# Patient Record
Sex: Male | Born: 1975
Health system: Southern US, Community
[De-identification: ages and names within clinical notes are randomized; demographics above are authoritative.]

## PROBLEM LIST (undated history)

## (undated) ENCOUNTER — Emergency Department (HOSPITAL_COMMUNITY): Admission: EM | Payer: Self-pay

---

## 1999-10-09 ENCOUNTER — Encounter: Payer: Self-pay | Admitting: Specialist

## 1999-10-09 ENCOUNTER — Ambulatory Visit (HOSPITAL_COMMUNITY): Admission: RE | Admit: 1999-10-09 | Discharge: 1999-10-09 | Payer: Self-pay | Admitting: Specialist

## 2009-01-07 ENCOUNTER — Emergency Department (HOSPITAL_BASED_OUTPATIENT_CLINIC_OR_DEPARTMENT_OTHER): Admission: EM | Admit: 2009-01-07 | Discharge: 2009-01-07 | Payer: Self-pay | Admitting: Emergency Medicine

## 2013-05-26 ENCOUNTER — Encounter (HOSPITAL_BASED_OUTPATIENT_CLINIC_OR_DEPARTMENT_OTHER): Payer: Self-pay | Admitting: *Deleted

## 2013-05-26 ENCOUNTER — Emergency Department (HOSPITAL_BASED_OUTPATIENT_CLINIC_OR_DEPARTMENT_OTHER)
Admission: EM | Admit: 2013-05-26 | Discharge: 2013-05-26 | Disposition: A | Discharge status: Home / Self Care | Payer: Self-pay | Attending: Emergency Medicine | Admitting: Emergency Medicine

## 2013-05-26 DIAGNOSIS — S4980XA Other specified injuries of shoulder and upper arm, unspecified arm, initial encounter: Secondary | ICD-10-CM | POA: Insufficient documentation

## 2013-05-26 DIAGNOSIS — T07XXXA Unspecified multiple injuries, initial encounter: Secondary | ICD-10-CM | POA: Insufficient documentation

## 2013-05-26 DIAGNOSIS — S8990XA Unspecified injury of unspecified lower leg, initial encounter: Secondary | ICD-10-CM | POA: Insufficient documentation

## 2013-05-26 DIAGNOSIS — S46909A Unspecified injury of unspecified muscle, fascia and tendon at shoulder and upper arm level, unspecified arm, initial encounter: Secondary | ICD-10-CM | POA: Insufficient documentation

## 2013-05-26 DIAGNOSIS — Y9289 Other specified places as the place of occurrence of the external cause: Secondary | ICD-10-CM | POA: Insufficient documentation

## 2013-05-26 DIAGNOSIS — M791 Myalgia, unspecified site: Secondary | ICD-10-CM

## 2013-05-26 DIAGNOSIS — R110 Nausea: Secondary | ICD-10-CM | POA: Insufficient documentation

## 2013-05-26 DIAGNOSIS — W108XXA Fall (on) (from) other stairs and steps, initial encounter: Secondary | ICD-10-CM | POA: Insufficient documentation

## 2013-05-26 DIAGNOSIS — W010XXA Fall on same level from slipping, tripping and stumbling without subsequent striking against object, initial encounter: Secondary | ICD-10-CM | POA: Insufficient documentation

## 2013-05-26 DIAGNOSIS — Y9389 Activity, other specified: Secondary | ICD-10-CM | POA: Insufficient documentation

## 2013-05-26 DIAGNOSIS — IMO0002 Reserved for concepts with insufficient information to code with codable children: Secondary | ICD-10-CM | POA: Insufficient documentation

## 2013-05-26 LAB — CBC WITH DIFFERENTIAL/PLATELET
Basophils Absolute: 0 10*3/uL (ref 0.0–0.1)
Basophils Relative: 0 % (ref 0–1)
Eosinophils Absolute: 1.7 10*3/uL — ABNORMAL HIGH (ref 0.0–0.7)
Eosinophils Relative: 18 % — ABNORMAL HIGH (ref 0–5)
HCT: 47 % (ref 39.0–52.0)
Hemoglobin: 16.5 g/dL (ref 13.0–17.0)
Lymphocytes Relative: 20 % (ref 12–46)
Lymphs Abs: 1.9 10*3/uL (ref 0.7–4.0)
MCH: 25.9 pg — ABNORMAL LOW (ref 26.0–34.0)
MCHC: 35.1 g/dL (ref 30.0–36.0)
MCV: 73.8 fL — ABNORMAL LOW (ref 78.0–100.0)
Monocytes Absolute: 1.9 10*3/uL — ABNORMAL HIGH (ref 0.1–1.0)
Monocytes Relative: 20 % — ABNORMAL HIGH (ref 3–12)
Neutro Abs: 4.2 10*3/uL (ref 1.7–7.7)
Neutrophils Relative %: 42 % — ABNORMAL LOW (ref 43–77)
Platelets: 376 10*3/uL (ref 150–400)
RBC: 6.37 MIL/uL — ABNORMAL HIGH (ref 4.22–5.81)
RDW: 14.9 % (ref 11.5–15.5)
WBC: 9.7 10*3/uL (ref 4.0–10.5)

## 2013-05-26 LAB — BASIC METABOLIC PANEL
BUN: 27 mg/dL — ABNORMAL HIGH (ref 6–23)
CO2: 26 mEq/L (ref 19–32)
Calcium: 9.9 mg/dL (ref 8.4–10.5)
Chloride: 91 mEq/L — ABNORMAL LOW (ref 96–112)
Creatinine, Ser: 1.1 mg/dL (ref 0.50–1.35)
GFR calc Af Amer: >90 mL/min (ref >90)
GFR calc non Af Amer: 85 mL/min — ABNORMAL LOW (ref >90)
Glucose, Bld: 112 mg/dL — ABNORMAL HIGH (ref 70–99)
Potassium: 5.2 mEq/L — ABNORMAL HIGH (ref 3.5–5.1)
Sodium: 128 mEq/L — ABNORMAL LOW (ref 135–145)

## 2013-05-26 LAB — CK: Total CK: 165 U/L (ref 7–232)

## 2013-05-26 NOTE — ED Provider Notes (Signed)
CSN: [id number]     Arrival date & time 05/26/13  1506 History     This chart was scribed for Shanna Cisco, MD by Jiles Prows, ED Scribe. The patient was seen in room MH12/MH12 and the patient's care was started at 4:25 PM.    Chief Complaint  Patient presents with  . Fall   Patient is a 37 y.o. male presenting with fall. The history is provided by the patient and medical records. No language interpreter was used.  Fall This is a new problem. The current episode started more than 1 week ago. The problem occurs rarely. The problem has not changed since onset.Pertinent negatives include no chest pain, no abdominal pain, no headaches and no shortness of breath. Nothing aggravates the symptoms. Nothing relieves the symptoms. He has tried ASA for the symptoms. The treatment provided no relief.   HPI Comments: Anthony Benjamin is a 37 y.o. male who presents to the Emergency Department complaining of generalized pain after a fall on the 6th of August.  Pt reports sliding and falling in a twisting motion down a 3 stairs on a metal staircase in [place] on the 6th.  He was concerned initially about his left knee, but felt most pain in right shoulder and lower back.   He claims that "everything" was hurting on the 7th, so he went to [facility].  He claims he was treated for minor contusions with Vicodin and ibuprofen.  He reports that he went back on the 12th and was treated similarly with an addition of flexoril.  He states that he was taking the Vicodin regularly and ibuprofen only as needed. He stopped this medication due to the constipation and lack of relief.    He claims today he has no energy. Pt claims about 15 hours of sleep over the last couple of days but "just doesn't feel well."  Her complains of nausea in addition to the constipation.  He reports that her has used 2 enemas.  He claims the pain is all over in his bones and muscles.  He states that his skin is even sensitive to touch. Pt  denies headache, diaphoresis, fever, chills, vomiting, diarrhea, cough, SOB and any other pain.  He denies recent sick contacts.  He claims he has been walking in the evenings lately which has improved symptoms, but is unable to settle down afterwards.  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  History reviewed. No pertinent past medical history. History reviewed. No pertinent past surgical history. History reviewed. No pertinent family history. History  Substance Use Topics  . Smoking status: Never Smoker   . Smokeless tobacco: Not on file  . Alcohol Use: No    Review of Systems  Constitutional: Negative for fever, activity change, appetite change and fatigue.  HENT: Negative for congestion, facial swelling, rhinorrhea and trouble swallowing.   Eyes: Negative for photophobia and pain.  Respiratory: Negative for cough, chest tightness and shortness of breath.   Cardiovascular: Negative for chest pain and leg swelling.  Gastrointestinal: Negative for nausea, vomiting, abdominal pain, diarrhea and constipation.  Endocrine: Negative for polydipsia and polyuria.  Genitourinary: Negative for dysuria, urgency, decreased urine volume and difficulty urinating.  Musculoskeletal: Negative for back pain and gait problem.  Skin: Negative for color change, rash and wound.  Allergic/Immunologic: Negative for immunocompromised state.  Neurological: Negative for dizziness, facial asymmetry, speech difficulty, weakness, numbness and headaches.  Psychiatric/Behavioral: Negative for confusion, decreased concentration  and agitation.    Allergies  Review of patient's allergies indicates no known allergies.  Home Medications   Current Outpatient Rx  Name  Route  Sig  Dispense  Refill  . cyclobenzaprine (FLEXERIL) 10 MG tablet   Oral   Take 10 mg by mouth 3 (three) times daily as needed for muscle spasms.         Marland Kitchen HYDROcodone-acetaminophen (NORCO/VICODIN) 5-325 MG per tablet    Oral   Take 1 tablet by mouth every 6 (six) hours as needed for pain.         Marland Kitchen ibuprofen (ADVIL,MOTRIN) 800 MG tablet   Oral   Take 800 mg by mouth every 8 (eight) hours as needed for pain.          BP 143/96  Pulse 114  Temp(Src) 98.6 F (37 C) (Oral)  Resp 16  Ht 5\' 11"  (1.803 m)  Wt 220 lb (99.791 kg)  BMI 30.7 kg/m2  SpO2 100% Physical Exam  Constitutional: He is oriented to person, place, and time. He appears well-developed and well-nourished. No distress.  HENT:  Head: Normocephalic and atraumatic.  Mouth/Throat: No oropharyngeal exudate.  Eyes: Pupils are equal, round, and reactive to light.  Neck: Normal range of motion. Neck supple.  Cardiovascular: Normal rate, regular rhythm and normal heart sounds.  Exam reveals no gallop and no friction rub.   No murmur heard. Pulmonary/Chest: Effort normal and breath sounds normal. No respiratory distress. He has no wheezes. He has no rales.  Abdominal: Bowel sounds are normal. He exhibits no distension and no mass. There is tenderness. There is no rebound and no guarding.  Musculoskeletal: Normal range of motion. He exhibits no edema and no tenderness.  Muscular tenderness to both sides of neck and backs of shoulders.  No midline tenderness.  Muscular tenderness to bilateral arms.    Neurological: He is alert and oriented to person, place, and time. He displays no tremor. No cranial nerve deficit or sensory deficit. He exhibits normal muscle tone. Coordination and gait normal. GCS eye subscore is 4. GCS verbal subscore is 5. GCS motor subscore is 6.  Normal strength bilaterally.  Skin: Skin is warm and dry. No rash noted.  Psychiatric: He has a normal mood and affect.   ED Course   Procedures (including critical care time) DIAGNOSTIC STUDIES: Filed Vitals:   05/26/13 1517  BP: 143/96  Pulse: 114  Temp: 98.6 F (37 C)  TempSrc: Oral  Resp: 16  Height: 5\' 11"  (1.803 m)  Weight: 220 lb (99.791 kg)  SpO2: 100%    COORDINATION OF CARE: 4:37 PM - Discussed ED treatment with pt at bedside including blood work and pt agrees.   Labs Reviewed  CBC WITH DIFFERENTIAL - Abnormal; Notable for the following:    RBC 6.37 (*)    MCV 73.8 (*)    MCH 25.9 (*)    Neutrophils Relative % 42 (*)    Monocytes Relative 20 (*)    Eosinophils Relative 18 (*)    Monocytes Absolute 1.9 (*)    Eosinophils Absolute 1.7 (*)    All other components within normal limits  BASIC METABOLIC PANEL - Abnormal; Notable for the following:    Sodium 128 (*)    Potassium 5.2 (*)    Chloride 91 (*)    Glucose, Bld 112 (*)    BUN 27 (*)    GFR calc non Af Amer 85 (*)    All other components within normal limits  CK   No results found. 1. Muscular pain     MDM   Pt is a 37 y.o. male with Pmhx as above who presents with generalized pain 12 days after a fall down 3 stairs at work.  This is 3rd ED visit for same. Afebrile, well-appearing, in NAD.  No signs of trauma on exam.  Benign cardiopulm exam. +ttp musculature with reported sensitivity to touch.  No focal neuro findings.  No localized bony pain on exam and reports nml XRs of back & BL shoulders at OSH.  Abdomen benign on exam. CBC, BMP, CK ordered and were unremarkable except for mild hyperkalemia at 5.2 (which I do not believe would cause symptoms and given nml diet & Cr, should not need temporized) and mild hyponatremia/hypochloremia.  Pt has been tolerating PO w/o difficulty and has been encouraged to drink plenty of fluids.  I do not believe he requires IVF treatment.  I believe pain to be muscular and recommend regular NSAID use over next week w/ light exercise & stretching, instead of sporatic NSAID or narcotic use.  He can also try miralax for constipation, up to 3 capfulls in Gatorade. I have encouraged him to establish with a local PCP and have labs rechecked in 1 week.  Return precautions given for new or worsening symptoms   1. Muscular pain     I personally  performed the services described in this documentation, which was scribed in my presence. The recorded information has been reviewed and is accurate.   Shanna Cisco, MD 05/27/13 1058

## 2013-05-26 NOTE — ED Notes (Signed)
Pt c/o fall on aug 6th, seen by [place] [facility] on aug 7 and 12th for same , pt cont to c/o generalized pain

## 2013-06-02 ENCOUNTER — Encounter (HOSPITAL_BASED_OUTPATIENT_CLINIC_OR_DEPARTMENT_OTHER): Payer: Self-pay

## 2013-06-02 ENCOUNTER — Emergency Department (HOSPITAL_BASED_OUTPATIENT_CLINIC_OR_DEPARTMENT_OTHER)
Admission: EM | Admit: 2013-06-02 | Discharge: 2013-06-02 | Disposition: A | Discharge status: Home / Self Care | Payer: Self-pay | Attending: Emergency Medicine | Admitting: Emergency Medicine

## 2013-06-02 DIAGNOSIS — M549 Dorsalgia, unspecified: Secondary | ICD-10-CM | POA: Insufficient documentation

## 2013-06-02 DIAGNOSIS — M25519 Pain in unspecified shoulder: Secondary | ICD-10-CM | POA: Insufficient documentation

## 2013-06-02 DIAGNOSIS — R21 Rash and other nonspecific skin eruption: Secondary | ICD-10-CM | POA: Insufficient documentation

## 2013-06-02 DIAGNOSIS — R209 Unspecified disturbances of skin sensation: Secondary | ICD-10-CM | POA: Insufficient documentation

## 2013-06-02 DIAGNOSIS — IMO0001 Reserved for inherently not codable concepts without codable children: Secondary | ICD-10-CM | POA: Insufficient documentation

## 2013-06-02 DIAGNOSIS — M25549 Pain in joints of unspecified hand: Secondary | ICD-10-CM | POA: Insufficient documentation

## 2013-06-02 DIAGNOSIS — M79641 Pain in right hand: Secondary | ICD-10-CM

## 2013-06-02 DIAGNOSIS — G8911 Acute pain due to trauma: Secondary | ICD-10-CM | POA: Insufficient documentation

## 2013-06-02 LAB — BASIC METABOLIC PANEL
CO2: 29 mEq/L (ref 19–32)
Chloride: 100 mEq/L (ref 96–112)
Glucose, Bld: 141 mg/dL — ABNORMAL HIGH (ref 70–99)
Sodium: 139 mEq/L (ref 135–145)

## 2013-06-02 MED ORDER — TRAMADOL HCL 50 MG PO TABS: 50.0000 mg | ORAL_TABLET | Freq: Four times a day (QID) | ORAL | Status: DC | PRN

## 2013-06-02 MED ORDER — PREDNISONE 10 MG PO TABS: ORAL_TABLET | ORAL | Status: DC

## 2013-06-02 NOTE — ED Provider Notes (Signed)
CSN: [id number]     Arrival date & time 06/02/13  7829 History     First MD Initiated Contact with Patient 06/02/13 1007     Chief Complaint  Patient presents with  . Hand Pain  . Shoulder Pain  . Back Pain  . Rash   (Consider location/radiation/quality/duration/timing/severity/associated sxs/prior Treatment) Patient is a 37 y.o. male presenting with hand pain, shoulder pain, back pain, and rash. The history is provided by the patient. No language interpreter was used.  Hand Pain This is a new problem. The current episode started today. The problem occurs constantly. The problem has been gradually worsening. Associated symptoms include myalgias. Pertinent negatives include no rash. Nothing aggravates the symptoms. He has tried nothing for the symptoms. The treatment provided moderate relief.  Shoulder Pain Associated symptoms include myalgias. Pertinent negatives include no rash.  Back Pain Rash Associated symptoms: myalgias    Pt complains of pain in both hands and in his back after a fall on August 6th.  Pt complains of numbness in both hands and pain in back..   Pt also reports he was told to get his potassium recheck.   It was 5.2 on last visit.   Results for orders placed during the hospital encounter of 06/02/13  BASIC METABOLIC PANEL      Result Value Range   Sodium 139  135 - 145 mEq/L   Potassium 3.6  3.5 - 5.1 mEq/L   Chloride 100  96 - 112 mEq/L   CO2 29  19 - 32 mEq/L   Glucose, Bld 141 (*) 70 - 99 mg/dL   BUN 8  6 - 23 mg/dL   Creatinine, Ser 5.62  0.50 - 1.35 mg/dL   Calcium 9.5  8.4 - 13.0 mg/dL   GFR calc non Af Amer >90  >90 mL/min   GFR calc Af Amer >90  >90 mL/min   No results found.  History reviewed. No pertinent past medical history. History reviewed. No pertinent past surgical history. No family history on file. History  Substance Use Topics  . Smoking status: Never Smoker   . Smokeless tobacco: Not on file  . Alcohol Use: No    Review of  Systems  Musculoskeletal: Positive for myalgias and back pain.  Skin: Negative for rash.  All other systems reviewed and are negative.    Allergies  Review of patient's allergies indicates no known allergies.  Home Medications   Current Outpatient Rx  Name  Route  Sig  Dispense  Refill  . ibuprofen (ADVIL,MOTRIN) 800 MG tablet   Oral   Take 800 mg by mouth every 8 (eight) hours as needed for pain.          BP 158/95  Pulse 116  Temp(Src) 97.9 F (36.6 C) (Oral)  Resp 20  Ht 5\' 11"  (1.803 m)  Wt 230 lb (104.327 kg)  BMI 32.09 kg/m2  SpO2 96% Physical Exam  Nursing note and vitals reviewed. Constitutional: He appears well-developed and well-nourished.  HENT:  Head: Normocephalic and atraumatic.  Eyes: Conjunctivae and EOM are normal. Pupils are equal, round, and reactive to light.  Neck: Normal range of motion. Neck supple.  Cardiovascular: Normal rate.   Pulmonary/Chest: Effort normal.  Abdominal: Soft.  Musculoskeletal: He exhibits tenderness.  Tender ls spine diffusely,   Tender bilat hands  Neurological: He is alert.  Skin: Skin is warm.  Psychiatric: He has a normal mood and affect.    ED Course   Procedures (including critical  care time)  Labs Reviewed  BASIC METABOLIC PANEL   No results found. 1. Bilateral hand pain   2. Back pain     MDM  Potasium is normal today.   I will try treating pt with prednisone.   Pt advised to follow up with Dr. Pearletha Forge for evaluation.  Pt given tramdol for pain  Elson Areas, PA-C 06/02/13 1149

## 2013-06-02 NOTE — ED Provider Notes (Signed)
History/physical exam/procedure(s) were performed by non-physician practitioner and as supervising physician I was immediately available for consultation/collaboration. I have reviewed all notes and am in agreement with care and plan.   Hilario Quarry, MD 06/02/13 [id number]

## 2013-06-02 NOTE — ED Notes (Addendum)
Pt reports bilateral hand pain, right shoulder and back pain since sustaining a fall on 05/14/2013.  He also reports a generalized rash since taking Ibuprofen on 05/27/2013.  Pt also asking to have potassium checked; previous level was 5.2

## 2013-06-02 NOTE — ED Notes (Signed)
MD at bedside. 

## 2013-06-02 NOTE — ED Notes (Signed)
Notified PA that patient states his "hands are feeling numb again."

## 2013-06-04 ENCOUNTER — Ambulatory Visit (INDEPENDENT_AMBULATORY_CARE_PROVIDER_SITE_OTHER): Payer: Self-pay | Admitting: Family Medicine

## 2013-06-04 ENCOUNTER — Encounter: Payer: Self-pay | Admitting: Family Medicine

## 2013-06-04 VITALS — BP 139/93 | HR 111 | Ht 71.0 in | Wt 225.0 lb

## 2013-06-04 DIAGNOSIS — M545 Low back pain, unspecified: Secondary | ICD-10-CM

## 2013-06-04 DIAGNOSIS — M79641 Pain in right hand: Secondary | ICD-10-CM

## 2013-06-04 DIAGNOSIS — M79609 Pain in unspecified limb: Secondary | ICD-10-CM

## 2013-06-04 MED ORDER — DICLOFENAC SODIUM 75 MG PO TBEC: 75.0000 mg | DELAYED_RELEASE_TABLET | Freq: Two times a day (BID) | ORAL | Status: DC

## 2013-06-04 MED ORDER — NORTRIPTYLINE HCL 25 MG PO CAPS: 25.0000 mg | ORAL_CAPSULE | Freq: Every day | ORAL | Status: DC

## 2013-06-04 MED ORDER — HYDROCODONE-ACETAMINOPHEN 5-325 MG PO TABS: 1.0000 | ORAL_TABLET | Freq: Four times a day (QID) | ORAL | Status: DC | PRN

## 2013-06-04 NOTE — Patient Instructions (Addendum)
You strained your piriformis and low back muscles. Take tylenol for baseline pain relief (1-2 extra strength tabs 3x/day) Finish your prednisone for this and the nerve pain in your hands. After finishing this take voltaren 75mg  twice a day with food for pain and inflammation. Norco as needed for severe pain (no driving on this medicine - can also make you constipated). Stay as active as possible. Do home exercises and stretches as directed - hold each for 20-30 seconds and do each one three times. Consider massage, chiropractor, physical therapy, and/or acupuncture. Physical therapy has been shown to be helpful while the others have mixed results. We could start this and do the nerve conduction studies of your arms when insurance/cone coverage goes through - call me when it does. Strengthening of low back muscles, abdominal musculature are key for long term pain relief. Nortriptyline is a nerve blocking medicine in the meantime you take at nighttime which also helps with sleep.

## 2013-06-05 ENCOUNTER — Ambulatory Visit: Payer: Self-pay | Admitting: Family Medicine

## 2013-06-10 ENCOUNTER — Encounter: Payer: Self-pay | Admitting: Family Medicine

## 2013-06-10 NOTE — Progress Notes (Signed)
Patient ID: Anthony Benjamin, male   DOB: July 20, 1976, 37 y.o.   MRN: [id number]  PCP: No PCP Per Patient  Subjective:   HPI: Patient is a 37 y.o. male here for low back, bilateral hand pain.  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 He fell forward and also twisted. He continued working, finished that work day. States by the next morning his pain was unbearable in low back and in both hands. Went to ED at [facility] twice as well as our [facility] ED. Had x-rays of shoulder, low back, and left knee that were negative for fracture. Continues to have pain in left low back radiating into left leg. Tried tramadol and prednisone - not much change on prednisone to date. Norco made him constipated. Has not done any exercises, therapy for this. Difficulty sleeping due to pain.  History reviewed. No pertinent past medical history.  Current Outpatient Prescriptions on File Prior to Visit  Medication Sig Dispense Refill  . predniSONE (DELTASONE) 10 MG tablet 6,5,4,3,2,1 taper  21 tablet  0   No current facility-administered medications on file prior to visit.    History reviewed. No pertinent past surgical history.  Allergies  Allergen Reactions  . Ibuprofen     Rash    History   Social History  . Marital Status: Single    Spouse Name: N/A    Number of Children: N/A  . Years of Education: N/A   Occupational History  . Not on file.   Social History Main Topics  . Smoking status: Never Smoker   . Smokeless tobacco: Not on file  . Alcohol Use: No  . Drug Use: No  . Sexual Activity: No   Other Topics Concern  . Not on file   Social History Narrative  . No narrative on file    Family History  Problem Relation Age of Onset  . Sudden death Neg Hx   . Hypertension Neg Hx   . Hyperlipidemia Neg Hx   . Heart attack Neg Hx   . Diabetes Neg Hx     BP 139/93  Pulse 111  Ht 5\' 11"  (1.803 m)  Wt 225 lb  (102.059 kg)  BMI 31.39 kg/m2  Review of Systems: See HPI above.    Objective:  Physical Exam:  Gen: NAD  Back: No gross deformity, scoliosis. TTP left buttock over piriformis, less paraspinal lumbar region on left.  No midline or bony TTP. FROM with pain on flexion. Strength LEs 5/5 all muscle groups.   2+ MSRs in patellar and achilles tendons, equal bilaterally. Negative SLRs. Sensation intact to light touch bilaterally. Negative logroll bilateral hips Positive piriformis on left, negative right.  Negative fabers.  Bilateral arms: No gross deformity, swelling, bruising, atrophy. No focal TTP wrist, hands, fingers, forearms. FROM wrist, elbows, digits. Strength 5/5 with finger abduction, extension, thumb opposition, wrist extension, elbow flexion/extension. Sensation diminished to light touch bilateral hands. Negative tinels carpal and cubital tunnels    Assessment & Plan:  1. Low back pain - consistent with piriformis syndrome and lumbar strain.  Finish prednisone then switch to voltaren.  Norco as needed.  Add PT when cone coverage goes through.  Shown home exercises in meantime.  2. Bilateral hand numbness, pain - likely a stretch injury though distribution is unusual and does not fit specifically with carpal tunnel or cubital tunnel, radiculopathy.  Start with nortriptyline.  Start PT for upper  extremity ROM, strengthening, modalities and could consider nerve conduction studies as well.

## 2013-06-11 ENCOUNTER — Encounter: Payer: Self-pay | Admitting: Family Medicine

## 2013-06-11 DIAGNOSIS — M79641 Pain in right hand: Secondary | ICD-10-CM | POA: Insufficient documentation

## 2013-06-11 DIAGNOSIS — M545 Low back pain: Secondary | ICD-10-CM | POA: Insufficient documentation

## 2013-06-11 NOTE — Assessment & Plan Note (Signed)
likely a stretch injury though distribution is unusual and does not fit specifically with carpal tunnel or cubital tunnel, radiculopathy.  Start with nortriptyline.  Start PT for upper extremity ROM, strengthening, modalities and could consider nerve conduction studies as well.

## 2013-06-11 NOTE — Assessment & Plan Note (Signed)
consistent with piriformis syndrome and lumbar strain.  Finish prednisone then switch to voltaren.  Norco as needed.  Add PT when cone coverage goes through.  Shown home exercises in meantime.

## 2013-06-19 NOTE — Addendum Note (Signed)
Addended by: Lenda Kelp on: 06/19/2013 12:51 PM   Modules accepted: Orders

## 2013-06-24 ENCOUNTER — Encounter: Payer: Self-pay | Admitting: Physical Medicine & Rehabilitation

## 2013-06-24 ENCOUNTER — Ambulatory Visit: Payer: Self-pay

## 2013-06-24 NOTE — Addendum Note (Signed)
Addended by: Lenda Kelp on: 06/24/2013 03:29 PM   Modules accepted: Orders

## 2013-06-26 ENCOUNTER — Ambulatory Visit: Payer: No Typology Code available for payment source | Admitting: Family Medicine

## 2013-06-26 ENCOUNTER — Encounter: Payer: Self-pay | Admitting: Family Medicine

## 2013-06-26 ENCOUNTER — Ambulatory Visit (INDEPENDENT_AMBULATORY_CARE_PROVIDER_SITE_OTHER): Payer: No Typology Code available for payment source | Admitting: Family Medicine

## 2013-06-26 ENCOUNTER — Ambulatory Visit: Payer: Self-pay

## 2013-06-26 VITALS — BP 156/125 | HR 98 | Ht 71.0 in | Wt 220.0 lb

## 2013-06-26 DIAGNOSIS — M79609 Pain in unspecified limb: Secondary | ICD-10-CM

## 2013-06-26 DIAGNOSIS — M79641 Pain in right hand: Secondary | ICD-10-CM

## 2013-06-26 DIAGNOSIS — M542 Cervicalgia: Secondary | ICD-10-CM

## 2013-06-26 MED ORDER — NORTRIPTYLINE HCL 50 MG PO CAPS: 50.0000 mg | ORAL_CAPSULE | Freq: Every day | ORAL | Status: DC

## 2013-06-26 NOTE — Patient Instructions (Addendum)
Take 2 capsules of nortriptyline for next 5 days. After this switch to nortriptyline 50mg  at bedtime. Get the MRI of your neck - this isn't ideal like the nerve conduction studies would be but may show the primary issue. If so, we could try cortisone shots in your neck for relief. If you want tramadol let me know and you can take this three times a day. We'll also check on if we can get you in with a neurologist for nerve conduction studies instead of the PM&R doctors.

## 2013-06-27 ENCOUNTER — Telehealth: Payer: Self-pay | Admitting: *Deleted

## 2013-06-27 MED ORDER — TRAMADOL HCL 50 MG PO TABS: 50.0000 mg | ORAL_TABLET | Freq: Three times a day (TID) | ORAL | Status: DC | PRN

## 2013-06-27 NOTE — Telephone Encounter (Signed)
Can call in tramadol 50 mg 1 tab every 8 hours as needed severe pain, 0 refills. Thanks!

## 2013-06-27 NOTE — Telephone Encounter (Signed)
Patient called and would like to get a prescription for Tramadol and uses the outpatient pharmacy downstairs.

## 2013-06-28 ENCOUNTER — Ambulatory Visit (HOSPITAL_BASED_OUTPATIENT_CLINIC_OR_DEPARTMENT_OTHER)
Admission: RE | Admit: 2013-06-28 | Discharge: 2013-06-28 | Disposition: A | Discharge status: Home / Self Care | Payer: No Typology Code available for payment source | Source: Ambulatory Visit | Attending: Family Medicine | Admitting: Family Medicine

## 2013-06-28 DIAGNOSIS — M542 Cervicalgia: Secondary | ICD-10-CM | POA: Insufficient documentation

## 2013-06-28 DIAGNOSIS — R209 Unspecified disturbances of skin sensation: Secondary | ICD-10-CM | POA: Insufficient documentation

## 2013-06-30 ENCOUNTER — Encounter: Payer: Self-pay | Admitting: Family Medicine

## 2013-06-30 NOTE — Assessment & Plan Note (Signed)
patient only has [employer] coverage, not improving with prednisone, nortriptyline, voltaren.  NCVs arranged but will take several months to get in.  Will increase nortriptyline and order MR cervical spine.  Will consider neurology referral as well - possible he could get in there sooner for NCVs and evaluation.

## 2013-06-30 NOTE — Progress Notes (Addendum)
Patient ID: Anthony Benjamin, male   DOB: Sep 09, 1976, 37 y.o.   MRN: [id number]  PCP: No PCP Per Patient  Subjective:   HPI: Patient is a 37 y.o. male here for low back, bilateral hand pain.  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 He fell forward and also twisted. He continued working, finished that work day. States by the next morning his pain was unbearable in low back and in both hands. Went to ED at [facility] twice as well as our [facility] ED. Had x-rays of shoulder, low back, and left knee that were negative for fracture. Continues to have pain in left low back radiating into left leg. Tried tramadol and prednisone - not much change on prednisone to date. Norco made him constipated. Has not done any exercises, therapy for this. Difficulty sleeping due to pain.  9/18: Patient reports low back has improved some. Right shoulder and pain into hands, tingling fingers has worsened. Constant 8/10 pain. Took prednisone, voltaren, and nortriptyline. Doesn't feel like these have helped. Right worse than left.  History reviewed. No pertinent past medical history.  Current Outpatient Prescriptions on File Prior to Visit  Medication Sig Dispense Refill  . diclofenac (VOLTAREN) 75 MG EC tablet Take 1 tablet (75 mg total) by mouth 2 (two) times daily.  60 tablet  1  . HYDROcodone-acetaminophen (NORCO) 5-325 MG per tablet Take 1 tablet by mouth every 6 (six) hours as needed for pain.  60 tablet  0  . predniSONE (DELTASONE) 10 MG tablet 6,5,4,3,2,1 taper  21 tablet  0   No current facility-administered medications on file prior to visit.    History reviewed. No pertinent past surgical history.  Allergies  Allergen Reactions  . Aspirin   . Ibuprofen     Rash    History   Social History  . Marital Status: Single    Spouse Name: N/A    Number of Children: N/A  . Years of Education: N/A    Occupational History  . Not on file.   Social History Main Topics  . Smoking status: Never Smoker   . Smokeless tobacco: Not on file  . Alcohol Use: No  . Drug Use: No  . Sexual Activity: No   Other Topics Concern  . Not on file   Social History Narrative  . No narrative on file    Family History  Problem Relation Age of Onset  . Sudden death Neg Hx   . Hypertension Neg Hx   . Hyperlipidemia Neg Hx   . Heart attack Neg Hx   . Diabetes Neg Hx     BP 156/125  Pulse 98  Ht 5\' 11"  (1.803 m)  Wt 220 lb (99.791 kg)  BMI 30.7 kg/m2  Review of Systems: See HPI above.    Objective:  Physical Exam:  Gen: NAD  Bilateral arms: No gross deformity, swelling, bruising, atrophy. No focal TTP wrist, hands, fingers, forearms. FROM wrist, elbows, digits. Strength 5/5 with finger abduction, extension, thumb opposition, wrist extension, elbow flexion/extension. Sensation diminished to light touch bilateral hands. Negative tinels carpal and cubital tunnels    Assessment & Plan:  1. Bilateral hand numbness, pain - patient only has GCC[employer] coverage, not improving with prednisone, nortriptyline, voltaren.  NCVs arranged but will take several months to get in.  Will increase nortriptyline and order MR cervical spine.  Will consider neurology referral as well - possible  he could get in there sooner for NCVs and evaluation.  Addendum:  MRI results reviewed and discussed with patient - no evidence of neural impingement.  As noted, will move forward with neurology referral and evaluation, possible NCVs/EMGs as next step.  Addendum:  NCVs/EMGs normal but no record of having had a neurology appointment, only had the studies done - to have appointment with them scheduled.

## 2013-07-01 NOTE — Addendum Note (Signed)
Addended by: Lenda Kelp on: 07/01/2013 04:09 PM   Modules accepted: Orders

## 2013-07-17 ENCOUNTER — Telehealth: Payer: Self-pay | Admitting: *Deleted

## 2013-07-17 NOTE — Telephone Encounter (Signed)
Patient called and said that he is having bilateral side pain and wanted to know if that could be coming from nerves and is it normal. The only time that it is painful is if something touches the sides from waistline to hips.

## 2013-07-17 NOTE — Telephone Encounter (Signed)
Yes, it's extremely unlikely that is from an orthopedic issue.  He needs to see neurology for an evaluation - see prior note.

## 2013-07-23 ENCOUNTER — Ambulatory Visit (INDEPENDENT_AMBULATORY_CARE_PROVIDER_SITE_OTHER): Payer: Self-pay | Admitting: Neurology

## 2013-07-23 ENCOUNTER — Encounter (INDEPENDENT_AMBULATORY_CARE_PROVIDER_SITE_OTHER): Payer: Self-pay

## 2013-07-23 DIAGNOSIS — S139XXD Sprain of joints and ligaments of unspecified parts of neck, subsequent encounter: Secondary | ICD-10-CM

## 2013-07-23 DIAGNOSIS — M79641 Pain in right hand: Secondary | ICD-10-CM

## 2013-07-23 DIAGNOSIS — R209 Unspecified disturbances of skin sensation: Secondary | ICD-10-CM

## 2013-07-23 DIAGNOSIS — Z0289 Encounter for other administrative examinations: Secondary | ICD-10-CM

## 2013-07-23 NOTE — Procedures (Signed)
  HISTORY:  Anthony Benjamin is a 37 year old gentleman with a history of a fall down stairs on 05/14/2013. The patient initially had no discomfort, but within the next several days, the patient developed significant pain and stiffness in the neck and shoulders, and tingling sensations down the arms, right greater than left. The patient has persistence of symptoms, and he is sent in for an evaluation for a possible neuropathy or a cervical radiculopathy. MRI evaluation of the cervical spine was within normal limits, with exception of some straightening of the cervical spine suggesting spasm.  NERVE CONDUCTION STUDIES:  Nerve conduction studies were performed on both upper extremities. The distal motor latencies and motor amplitudes for the median and ulnar nerves were within normal limits. The F wave latencies and nerve conduction velocities for these nerves were also normal. The sensory latencies for the median and ulnar nerves were normal.   EMG STUDIES:  EMG study was performed on the left upper extremity:  The first dorsal interosseous muscle reveals 2 to 4 K units with full recruitment. No fibrillations or positive waves were noted. The abductor pollicis brevis muscle reveals 2 to 4 K units with full recruitment. No fibrillations or positive waves were noted. The extensor indicis proprius muscle reveals 1 to 3 K units with full recruitment. No fibrillations or positive waves were noted. The pronator teres muscle reveals 2 to 3 K units with full recruitment. No fibrillations or positive waves were noted. The biceps muscle reveals 1 to 2 K units with full recruitment. No fibrillations or positive waves were noted. The triceps muscle reveals 2 to 4 K units with full recruitment. No fibrillations or positive waves were noted. The anterior deltoid muscle reveals 2 to 3 K units with full recruitment. No fibrillations or positive waves were noted. The cervical paraspinal muscles were tested at 2  levels. No abnormalities of insertional activity were seen at either level tested. There was good relaxation.  EMG study was performed on the right upper extremity:  The first dorsal interosseous muscle reveals 2 to 4 K units with full recruitment. No fibrillations or positive waves were noted. The abductor pollicis brevis muscle reveals 2 to 4 K units with full recruitment. No fibrillations or positive waves were noted. The extensor indicis proprius muscle reveals 1 to 3 K units with full recruitment. No fibrillations or positive waves were noted. The pronator teres muscle reveals 2 to 3 K units with full recruitment. No fibrillations or positive waves were noted. The biceps muscle reveals 1 to 2 K units with full recruitment. No fibrillations or positive waves were noted. The triceps muscle reveals 2 to 4 K units with full recruitment. No fibrillations or positive waves were noted. The anterior deltoid muscle reveals 2 to 3 K units with full recruitment. No fibrillations or positive waves were noted. The cervical paraspinal muscles were tested at 2 levels. No abnormalities of insertional activity were seen at either level tested. There was good relaxation.   IMPRESSION:  Nerve conduction studies done on both upper extremities were within normal limits. No evidence of a neuropathy is seen. EMG evaluations of both upper extremities were unremarkable, without evidence of cervical radiculopathy on either side.  Marlan Palau MD 07/23/2013 11:24 AM  [facility] [address] [place], [place] [address]  Phone [phone] Fax [phone]

## 2013-07-31 ENCOUNTER — Telehealth: Payer: Self-pay | Admitting: *Deleted

## 2013-07-31 NOTE — Telephone Encounter (Signed)
Patient called and said that he is out of Norco (got it in august)  and Nortriptyline (got this in September). Is still taking the Voltaren and the Tramadol is not helping. Uses pharmacy downstairs.Still having pain in hands and right shoulder.

## 2013-07-31 NOTE — Telephone Encounter (Signed)
There's a refill on the nortriptyline.  You can give him one last refill on tramadol if he wants it as well.

## 2013-08-04 ENCOUNTER — Telehealth: Payer: Self-pay | Admitting: *Deleted

## 2013-08-04 NOTE — Telephone Encounter (Signed)
Patient called to say that they are about out of the Voltaren (have one more day)  and if they need to stay on this medication.  Will call in if you want the patient to continue on this medication.

## 2013-08-05 ENCOUNTER — Other Ambulatory Visit: Payer: Self-pay | Admitting: *Deleted

## 2013-08-05 DIAGNOSIS — M545 Low back pain: Secondary | ICD-10-CM

## 2013-08-05 MED ORDER — DICLOFENAC SODIUM 75 MG PO TBEC: 75.0000 mg | DELAYED_RELEASE_TABLET | Freq: Two times a day (BID) | ORAL | Status: DC

## 2013-08-05 NOTE — Telephone Encounter (Signed)
Ok to call this in - same directions with 1 additional refill.  Thanks!

## 2013-08-22 ENCOUNTER — Ambulatory Visit: Payer: No Typology Code available for payment source | Attending: Internal Medicine | Admitting: Internal Medicine

## 2013-08-22 VITALS — BP 136/85 | HR 86 | Temp 98.1°F | Resp 17 | Ht 71.0 in | Wt 234.2 lb

## 2013-08-22 DIAGNOSIS — T148XXA Other injury of unspecified body region, initial encounter: Secondary | ICD-10-CM

## 2013-08-22 LAB — CBC WITH DIFFERENTIAL/PLATELET
Eosinophils Absolute: 0.1 10*3/uL (ref 0.0–0.7)
Eosinophils Relative: 2 % (ref 0–5)
HCT: 40.4 % (ref 39.0–52.0)
Hemoglobin: 13.6 g/dL (ref 13.0–17.0)
Lymphs Abs: 2.5 10*3/uL (ref 0.7–4.0)
MCH: 25.6 pg — ABNORMAL LOW (ref 26.0–34.0)
MCV: 75.9 fL — ABNORMAL LOW (ref 78.0–100.0)
Monocytes Absolute: 0.6 10*3/uL (ref 0.1–1.0)
Monocytes Relative: 9 % (ref 3–12)
RBC: 5.32 MIL/uL (ref 4.22–5.81)

## 2013-08-22 LAB — HEMOGLOBIN A1C: Hgb A1c MFr Bld: 5.9 % — ABNORMAL HIGH (ref <5.7)

## 2013-08-22 MED ORDER — PREGABALIN 75 MG PO CAPS: 150.0000 mg | ORAL_CAPSULE | Freq: Two times a day (BID) | ORAL | Status: DC

## 2013-08-22 MED ORDER — ACETAMINOPHEN-CODEINE #3 300-30 MG PO TABS: 1.0000 | ORAL_TABLET | Freq: Three times a day (TID) | ORAL | Status: DC | PRN

## 2013-08-22 NOTE — Progress Notes (Unsigned)
Patient here to establish care Complains of cyst under right breast Pain to right shoulder and bilateral hips

## 2013-08-22 NOTE — Progress Notes (Unsigned)
Patient ID: Anthony Benjamin, male   DOB: Jan 04, 1976, 37 y.o.   MRN: [id number]  CC:  HPI:   Patient is a 37 y.o. male here for low back, bilateral hand pain.  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  He fell forward and also twisted.  He continued working, finished that work day.  States by the next morning his pain was unbearable in low back and in both hands.  Went to ED at [facility] twice as well as our [facility] ED.  Had x-rays of shoulder, low back, and left knee that were negative for fracture.  Continues to have pain in left low back radiating into left leg.  Tried tramadol and prednisone - not much change on prednisone to date.  Norco made him constipated.  Has not done any exercises, therapy for this.  Difficulty sleeping due to pain.   9/18:  Patient reports low back has improved some.  Right shoulder and pain into hands, tingling fingers has worsened.  Constant 8/10 pain.  Took prednisone, voltaren, and nortriptyline.  Doesn't feel like these have helped.  Right worse than left.   11/14 Patient had nerve conduction studies/EMG study done He had MRI of the C-spine done which was negative Today the patient is complaining of right shoulder pain, paresthesias in both his hands, He also complains of bilateral flank pain as well as pain over his trochanteric bursa when he palpates it He denies any gait instability  He also complains of a cyst in his right upper quadrant that appears to be subcutaneous with no skin involvement He states that his diclofenac and nortriptyline have not been working He has also tried prednisone in August with no relief  Allergies  Allergen Reactions  . Aspirin   . Ibuprofen     Rash   History reviewed. No pertinent past medical history. Current Outpatient Prescriptions on File Prior to Visit  Medication Sig Dispense Refill  . diclofenac (VOLTAREN) 75 MG EC  tablet Take 1 tablet (75 mg total) by mouth 2 (two) times daily.  60 tablet  1  . nortriptyline (PAMELOR) 50 MG capsule Take 1 capsule (50 mg total) by mouth at bedtime.  30 capsule  1  . HYDROcodone-acetaminophen (NORCO) 5-325 MG per tablet Take 1 tablet by mouth every 6 (six) hours as needed for pain.  60 tablet  0  . predniSONE (DELTASONE) 10 MG tablet 6,5,4,3,2,1 taper  21 tablet  0  . traMADol (ULTRAM) 50 MG tablet Take 1 tablet (50 mg total) by mouth every 8 (eight) hours as needed for pain.  90 tablet  0   No current facility-administered medications on file prior to visit.   Family History  Problem Relation Age of Onset  . Sudden death Neg Hx   . Hypertension Neg Hx   . Hyperlipidemia Neg Hx   . Heart attack Neg Hx   . Diabetes Neg Hx    History   Social History  . Marital Status: Single    Spouse Name: N/A    Number of Children: N/A  . Years of Education: N/A   Occupational History  . Not on file.   Social History Main Topics  . Smoking status: Never Smoker   . Smokeless tobacco: Not on file  . Alcohol Use: No  . Drug Use: No  . Sexual Activity: No   Other Topics Concern  . Not on  file   Social History Narrative  . No narrative on file    Review of Systems  Constitutional: Negative for fever, chills, diaphoresis, activity change, appetite change and fatigue.  HENT: Negative for ear pain, nosebleeds, congestion, facial swelling, rhinorrhea, neck pain, neck stiffness and ear discharge.   Eyes: Negative for pain, discharge, redness, itching and visual disturbance.  Respiratory: Negative for cough, choking, chest tightness, shortness of breath, wheezing and stridor.   Cardiovascular: Negative for chest pain, palpitations and leg swelling.  Gastrointestinal: Negative for abdominal distention.  Genitourinary: Negative for dysuria, urgency, frequency, hematuria, flank pain, decreased urine volume, difficulty urinating and dyspareunia.  Musculoskeletal: Negative for  back pain, joint swelling, arthralgias and gait problem.  Neurological: Negative for dizziness, tremors, seizures, syncope, facial asymmetry, speech difficulty, weakness, light-headedness, numbness and headaches.  Hematological: Negative for adenopathy. Does not bruise/bleed easily.  Psychiatric/Behavioral: Negative for hallucinations, behavioral problems, confusion, dysphoric mood, decreased concentration and agitation.    Objective:   Filed Vitals:   08/22/13 0935  BP: 136/85  Pulse: 86  Temp: 98.1 F (36.7 C)  Resp: 17    Physical Exam  Constitutional: Appears well-developed and well-nourished. No distress.  HENT: Normocephalic. External right and left ear normal. Oropharynx is clear and moist.  Eyes: Conjunctivae and EOM are normal. PERRLA, no scleral icterus.  Neck: Normal ROM. Neck supple. No JVD. No tracheal deviation. No thyromegaly.  CVS: RRR, S1/S2 +, no murmurs, no gallops, no carotid bruit.  Pulmonary: Effort and breath sounds normal, no stridor, rhonchi, wheezes, rales.  Abdominal: Soft. BS +,  no distension, tenderness, rebound or guarding.  Musculoskeletal: Normal range of motion. No edema and no tenderness.  Lymphadenopathy: No lymphadenopathy noted, cervical, inguinal. Neuro: Alert. Normal reflexes, muscle tone coordination. No cranial nerve deficit. Skin: Skin is warm and dry. No rash noted. Not diaphoretic. No erythema. No pallor.  Psychiatric: Normal mood and affect. Behavior, judgment, thought content normal.   Lab Results  Component Value Date   WBC 9.7 05/26/2013   HGB 16.5 05/26/2013   HCT 47.0 05/26/2013   MCV 73.8* 05/26/2013   PLT 376 05/26/2013   Lab Results  Component Value Date   CREATININE 0.80 06/02/2013   BUN 8 06/02/2013   NA 139 06/02/2013   K 3.6 06/02/2013   CL 100 06/02/2013   CO2 29 06/02/2013    No results found for this basename: HGBA1C   Lipid Panel  No results found for this basename: chol, trig, hdl, cholhdl, vldl, ldlcalc        Assessment and plan:   Patient Active Problem List   Diagnosis Date Noted  . Low back pain 06/11/2013  . Bilateral hand pain 06/11/2013   Bilateral hand numbness/right shoulder pain Not improving with prednisone, nortriptyline, diclofenac Studies negative as mentioned above MRI of the right shoulder as this seems to be the primary problem Also rheumatologic workup including ESR, rheumatoid factor, ANA, CRP   Subcutaneous cyst in the right upper quadrant Order to have a right upper quadrant ultrasound  Follow up in 2 weeks      The patient was given clear instructions to go to ER or return to medical center if symptoms don't improve, worsen or new problems develop. The patient verbalized understanding. The patient was told to call to get any lab results if not heard anything in the next week.

## 2013-08-23 LAB — CK TOTAL AND CKMB (NOT AT ARMC)
Relative Index: 0.3 (ref 0.0–4.0)
Total CK: 276 U/L — ABNORMAL HIGH (ref 7–232)

## 2013-08-23 LAB — TSH: TSH: 1.805 u[IU]/mL (ref 0.350–4.500)

## 2013-08-27 ENCOUNTER — Telehealth: Payer: Self-pay | Admitting: General Practice

## 2013-08-27 DIAGNOSIS — R222 Localized swelling, mass and lump, trunk: Secondary | ICD-10-CM

## 2013-08-27 NOTE — Telephone Encounter (Signed)
Patient has a question about his medication. They are too expensive.

## 2013-08-28 MED ORDER — GABAPENTIN 100 MG PO CAPS: 100.0000 mg | ORAL_CAPSULE | Freq: Three times a day (TID) | ORAL | Status: DC

## 2013-08-28 NOTE — Telephone Encounter (Signed)
Pt called Script for Lyrica too expensive. Can we prescribe alternative?

## 2013-08-28 NOTE — Telephone Encounter (Signed)
Medication switched to Gabapentin 100 mg TID per Dr.Abrol. Pt aware

## 2013-08-29 ENCOUNTER — Ambulatory Visit (HOSPITAL_COMMUNITY): Payer: No Typology Code available for payment source

## 2013-09-02 ENCOUNTER — Ambulatory Visit (HOSPITAL_COMMUNITY)
Admission: RE | Admit: 2013-09-02 | Discharge: 2013-09-02 | Disposition: A | Discharge status: Home / Self Care | Payer: No Typology Code available for payment source | Source: Ambulatory Visit | Attending: Internal Medicine | Admitting: Internal Medicine

## 2013-09-02 ENCOUNTER — Other Ambulatory Visit: Payer: Self-pay | Admitting: Internal Medicine

## 2013-09-02 DIAGNOSIS — R1901 Right upper quadrant abdominal swelling, mass and lump: Secondary | ICD-10-CM | POA: Insufficient documentation

## 2013-09-02 DIAGNOSIS — T148XXA Other injury of unspecified body region, initial encounter: Secondary | ICD-10-CM

## 2013-09-02 DIAGNOSIS — R209 Unspecified disturbances of skin sensation: Secondary | ICD-10-CM | POA: Insufficient documentation

## 2013-09-02 NOTE — Addendum Note (Signed)
Addended by: Susie Cassette MD, Germain Osgood on: 09/02/2013 09:43 AM   Modules accepted: Orders

## 2013-09-12 ENCOUNTER — Ambulatory Visit: Payer: No Typology Code available for payment source | Attending: Internal Medicine | Admitting: Internal Medicine

## 2013-09-12 ENCOUNTER — Encounter: Payer: Self-pay | Admitting: Internal Medicine

## 2013-09-12 VITALS — BP 154/94 | HR 70 | Temp 99.1°F | Resp 16 | Ht 71.0 in | Wt 232.0 lb

## 2013-09-12 DIAGNOSIS — G629 Polyneuropathy, unspecified: Secondary | ICD-10-CM | POA: Insufficient documentation

## 2013-09-12 DIAGNOSIS — M79609 Pain in unspecified limb: Secondary | ICD-10-CM

## 2013-09-12 DIAGNOSIS — R1011 Right upper quadrant pain: Secondary | ICD-10-CM

## 2013-09-12 DIAGNOSIS — M545 Low back pain: Secondary | ICD-10-CM

## 2013-09-12 DIAGNOSIS — M79641 Pain in right hand: Secondary | ICD-10-CM

## 2013-09-12 DIAGNOSIS — G589 Mononeuropathy, unspecified: Secondary | ICD-10-CM

## 2013-09-12 MED ORDER — ALPRAZOLAM 1 MG PO TABS: 1.0000 mg | ORAL_TABLET | Freq: Two times a day (BID) | ORAL | Status: DC | PRN

## 2013-09-12 NOTE — Patient Instructions (Signed)
Magnetic Resonance Cholangiopancreatography  Magnetic resonance cholangiopancreatography (MRCP) is a procedure that lets a radiologist see and assess specific organs in your belly (abdomen). A radiologist is a doctor specializing in x-ray and imaging tests. During an MRCP, your doctor can see and access your:  · Bile duct (tube that carries bile from the gallbladder to the intestine).  · Pancreatic duct (tube that carries juices made by the pancreas to the intestine).  · Gallbladder (pear-shaped body organ in which bile is stored).  This procedure helps detect diseases, stones or possible cancers affecting these organs. A MRCP does not need a dye that is injected into the vein (contrast material). The scan does not require instruments, injections or drugs that help you sleep (anesthesia).   MRCP uses an imaging technique known as magnetic resonance imaging (MRI). This technique uses magnetic fields, radio waves and a computer to produce a detailed picture of the inside of the body. No radiation is used in this procedure.  No treatment is done during the procedure.  PREPARATION FOR TEST   · Have nothing to eat or drink for about 4 hours before the procedure.  · Metallic objects cannot be brought into the imaging room. Remove any jewelry, credit cards, hearing aids, pins, hairpins, metal zippers, watches, pens, pocketknives, removable dental implants, eyeglasses or other metallic items before entering the scanner area.  · Inform your caregiver if you have any objects implanted in your body. This procedure can interfere with their functioning. The test cannot be done if you have the following:  · Heart (cardiac) pacemaker.  · Artificial heart valves (not all of them).  · Inner ear (cochlear) implants.  · Older blood vessel stents.  · Metal clips used to repair a brain aneurysm (small out pouching that looks like a berry in the artery).  · Permanent metal dental implants.  · Artificial joints.  · Before the MRCP scan,  you may need a standard MRI of your abdomen.  · You will need to lie on a table. You will then be wheeled inside the scanner. The scanner looks like a round tube.  · A belt-like coil, which helps take clearer images, may be put around your waist.  · If you feel that the table is hard or cold. You may request a blanket or pillow.  · You may be given earplugs. The machine produces loud thumping and humming noises while working.  · It is important to lie still during the scan.  · You may be asked to hold your breath for a few seconds during the scan.  · The technician that operates the machine will be in constant contact with you. They will be in a different room, but they can see you through a window.  · Speakers inside the scanner let you to talk to staff.  · If you have any problems during the procedure, there is a call bell to ring.  · The scan does not cause pain.  · If you feel distressed or closed in (claustrophobic), a soothing drug (sedative) may be offered.  · There are no after-effects from the scan.  NORMAL FINDINGS   A normal result suggests that no stones, blockages, growths or other problems were found with the structures or organs discussed at the beginning of this document. This does not guarantee that no problems exist.  MEANING OF TEST  Your caregiver may recommend this procedure if he/she suspects:  · A blockage or stone in the bile duct.  ·   Cancer of the bile duct or pancreas.  · Irritation and swelling (inflammation) of the gallbladder.  · Other problems with the organs or ducts described above.  OBTAINING THE TEST RESULTS   Not all test results are available during your visit. If your test results are not back during the visit, make an appointment with your caregiver to find out the results. Do not assume everything is normal if you have not heard from your caregiver or the medical facility. It is important for you to follow up on all of your test results.  Document Released: 03/13/2008 Document  Revised: 12/18/2011 Document Reviewed: 03/13/2008  [employer]® Patient Information ©2014 [employer].

## 2013-09-12 NOTE — Progress Notes (Signed)
Pt is here to discuss MRI results shoulders/arm with pain management. States meds prescribed not working for 8/10 constant achy pain with limited ROM States gabapentin makes him fatigue

## 2013-09-12 NOTE — Progress Notes (Signed)
Patient ID: Anthony Benjamin, male   DOB: Apr 12, 1976, 36 y.o.   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 Carpal tunnel was apparently ruled out with normal nerve conduction studies. RUQ abdominal pain is somewhat better.  Allergies  Allergen Reactions  . Aspirin   . Ibuprofen     Rash   History reviewed. No pertinent past medical history. Current Outpatient Prescriptions on File Prior to Visit  Medication Sig Dispense Refill  . acetaminophen-codeine (TYLENOL #3) 300-30 MG per tablet Take 1 tablet by mouth every 8 (eight) hours as needed for moderate pain.  30 tablet  0  . diclofenac (VOLTAREN) 75 MG EC tablet Take 1 tablet (75 mg total) by mouth 2 (two) times daily.  60 tablet  1  . gabapentin (NEURONTIN) 100 MG capsule Take 1 capsule (100 mg total) by mouth 3 (three) times daily.  90 capsule  3  . HYDROcodone-acetaminophen (NORCO) 5-325 MG per tablet Take 1 tablet by mouth every 6 (six) hours as needed for pain.  60 tablet  0  . nortriptyline (PAMELOR) 50 MG capsule Take 1 capsule (50 mg total) by mouth at bedtime.  30 capsule  1  . predniSONE (DELTASONE) 10 MG tablet 6,5,4,3,2,1 taper  21 tablet  0  . traMADol (ULTRAM) 50 MG tablet Take 1 tablet (50 mg total) by mouth every 8 (eight) hours as needed for pain.  90 tablet  0   No current facility-administered medications on file prior to visit.   Family History  Problem Relation Age of Onset  . Sudden death Neg Hx   . Hypertension Neg Hx   . Hyperlipidemia Neg Hx   . Heart attack Neg Hx   . Diabetes Neg Hx    History   Social History  . Marital Status: Single    Spouse Name: N/A    Number of Children: N/A  . Years of Education: N/A   Occupational History  . Not on file.   Social History Main Topics  . Smoking status: Never Smoker   . Smokeless tobacco: Not on file  . Alcohol Use: No   . Drug Use: No  . Sexual Activity: No   Other Topics Concern  . Not on file   Social History Narrative  . No narrative on file    Review of Systems  Constitutional: Negative for fever, chills, diaphoresis, activity change, appetite change and fatigue.  HENT: Negative for ear pain, nosebleeds, congestion, facial swelling, rhinorrhea, neck pain, neck stiffness and ear discharge.   Eyes: Negative for pain, discharge, redness, itching and visual disturbance.  Respiratory: Negative for cough, choking, chest tightness, shortness of breath, wheezing and stridor.   Cardiovascular: Negative for chest pain, palpitations and leg swelling.  Gastrointestinal: Negative for abdominal distention.  Genitourinary: Negative for dysuria, urgency, frequency, hematuria, flank pain, decreased urine volume, difficulty urinating and dyspareunia.  Musculoskeletal: Negative for back pain, joint swelling, arthralgias and gait problem.  Neurological: Negative for dizziness, tremors, seizures, syncope, facial asymmetry, speech difficulty, weakness, light-headedness, positive for numbness and no headaches.  Hematological: Negative for adenopathy. Does not bruise/bleed easily.  Psychiatric/Behavioral: Negative for hallucinations, behavioral problems, confusion, dysphoric mood, decreased concentration and agitation.    Objective:   Filed Vitals:   09/12/13 1140  BP: 154/94  Pulse: 70  Temp: 99.1 F (37.3 C)  Resp: 16  Physical Exam  Constitutional: Appears well-developed and well-nourished. No distress.  HENT: Normocephalic. External right and left ear normal. Oropharynx is clear and moist.  Eyes: Conjunctivae and EOM are normal. PERRLA, no scleral icterus.  Neck: Normal ROM. Neck supple. No JVD. No tracheal deviation. No thyromegaly.  CVS: RRR, S1/S2 +, no murmurs, no gallops, no carotid bruit.  Pulmonary: Effort and breath sounds normal, no stridor, rhonchi, wheezes, rales.  Abdominal: Soft. BS +,  no  distension, RUQ tenderness, no rebound or guarding.  Musculoskeletal: Normal range of motion. No edema and no tenderness.  Lymphadenopathy: No lymphadenopathy noted, cervical, inguinal. Neuro: Alert. Normal reflexes, muscle tone coordination. No cranial nerve deficit. Skin: Skin is warm and dry. No rash noted. Not diaphoretic. No erythema. No pallor.  Psychiatric: Normal mood and affect. Behavior, judgment, thought content normal.   Lab Results  Component Value Date   WBC 6.7 08/22/2013   HGB 13.6 08/22/2013   HCT 40.4 08/22/2013   MCV 75.9* 08/22/2013   PLT 270 08/22/2013   Lab Results  Component Value Date   CREATININE 0.80 06/02/2013   BUN 8 06/02/2013   NA 139 06/02/2013   K 3.6 06/02/2013   CL 100 06/02/2013   CO2 29 06/02/2013    Lab Results  Component Value Date   HGBA1C 5.9* 08/22/2013   Lipid Panel  No results found for this basename: chol, trig, hdl, cholhdl, vldl, ldlcalc       Assessment and plan:   Patient Active Problem List   Diagnosis Date Noted  . RUQ abdominal pain 09/12/2013    Priority: High - RUQ abdominal US revealed soft tissue mass which requires further eval with MRCP; order in place  . Neuropathy 09/12/2013    Priority: Medium - obtain MR right shoulder with contrast

## 2013-09-17 ENCOUNTER — Encounter (HOSPITAL_BASED_OUTPATIENT_CLINIC_OR_DEPARTMENT_OTHER): Payer: Self-pay | Admitting: Emergency Medicine

## 2013-09-17 ENCOUNTER — Emergency Department (HOSPITAL_BASED_OUTPATIENT_CLINIC_OR_DEPARTMENT_OTHER)
Admission: EM | Admit: 2013-09-17 | Discharge: 2013-09-17 | Disposition: A | Discharge status: Home / Self Care | Payer: No Typology Code available for payment source | Attending: Emergency Medicine | Admitting: Emergency Medicine

## 2013-09-17 DIAGNOSIS — M25549 Pain in joints of unspecified hand: Secondary | ICD-10-CM | POA: Insufficient documentation

## 2013-09-17 DIAGNOSIS — G8929 Other chronic pain: Secondary | ICD-10-CM

## 2013-09-17 DIAGNOSIS — R209 Unspecified disturbances of skin sensation: Secondary | ICD-10-CM | POA: Insufficient documentation

## 2013-09-17 DIAGNOSIS — Z79899 Other long term (current) drug therapy: Secondary | ICD-10-CM | POA: Insufficient documentation

## 2013-09-17 MED ORDER — HYDROCODONE-ACETAMINOPHEN 5-325 MG PO TABS: 2.0000 | ORAL_TABLET | ORAL | Status: DC | PRN

## 2013-09-17 NOTE — ED Notes (Signed)
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  States he has had multiple test and is scheduled to see pain management in about three weeks.  States he needs some pain medication for the short term.  Was referred back to the Ed from [facility].

## 2013-09-17 NOTE — ED Provider Notes (Signed)
Medical screening examination/treatment/procedure(s) were performed by non-physician practitioner and as supervising physician I was immediately available for consultation/collaboration.  EKG Interpretation   None         Rolan Bucco, MD 09/17/13 1458

## 2013-09-17 NOTE — ED Provider Notes (Signed)
CSN: [id number]     Arrival date & time 09/17/13  1351 History   First MD Initiated Contact with Patient 09/17/13 1417     Chief Complaint  Patient presents with  . Hand Pain   (Consider location/radiation/quality/duration/timing/severity/associated sxs/prior Treatment) Patient is a 37 y.o. male presenting with hand pain. The history is provided by the patient. No language interpreter was used.  Hand Pain This is a chronic problem. Episode onset: 4 months. The problem occurs constantly. The problem has been gradually worsening. Associated symptoms include numbness. Nothing aggravates the symptoms. He has tried nothing for the symptoms. The treatment provided no relief.  Pt reports he has had hand and arm pain for 4 months.   Pt requesting pain medications.  Pt reports no relief from current medications.  Pt not taking gabapentin.  History reviewed. No pertinent past medical history. History reviewed. No pertinent past surgical history. Family History  Problem Relation Age of Onset  . Sudden death Neg Hx   . Hypertension Neg Hx   . Hyperlipidemia Neg Hx   . Heart attack Neg Hx   . Diabetes Neg Hx    History  Substance Use Topics  . Smoking status: Never Smoker   . Smokeless tobacco: Not on file  . Alcohol Use: No    Review of Systems  Neurological: Positive for numbness.  All other systems reviewed and are negative.    Allergies  Aspirin and Ibuprofen  Home Medications   Current Outpatient Rx  Name  Route  Sig  Dispense  Refill  . ALPRAZolam (XANAX) 1 MG tablet   Oral   Take 1 tablet (1 mg total) by mouth 2 (two) times daily as needed for anxiety.   45 tablet   0   . acetaminophen-codeine (TYLENOL #3) 300-30 MG per tablet   Oral   Take 1 tablet by mouth every 8 (eight) hours as needed for moderate pain.   30 tablet   0   . diclofenac (VOLTAREN) 75 MG EC tablet   Oral   Take 1 tablet (75 mg total) by mouth 2 (two) times daily.   60 tablet   1   . gabapentin  (NEURONTIN) 100 MG capsule   Oral   Take 1 capsule (100 mg total) by mouth 3 (three) times daily.   90 capsule   3   . HYDROcodone-acetaminophen (NORCO) 5-325 MG per tablet   Oral   Take 1 tablet by mouth every 6 (six) hours as needed for pain.   60 tablet   0   . nortriptyline (PAMELOR) 50 MG capsule   Oral   Take 1 capsule (50 mg total) by mouth at bedtime.   30 capsule   1   . predniSONE (DELTASONE) 10 MG tablet      6,5,4,3,2,1 taper   21 tablet   0   . traMADol (ULTRAM) 50 MG tablet   Oral   Take 1 tablet (50 mg total) by mouth every 8 (eight) hours as needed for pain.   90 tablet   0    BP 144/89  Pulse 92  Temp(Src) 98.6 F (37 C) (Oral)  Resp 16  Ht 5' 11.5" (1.816 m)  Wt 235 lb (106.595 kg)  BMI 32.32 kg/m2  SpO2 100% Physical Exam  Nursing note and vitals reviewed. Constitutional: He appears well-developed and well-nourished.  HENT:  Head: Normocephalic.  Cardiovascular: Normal rate.   Pulmonary/Chest: Effort normal.  Abdominal: Soft.  Musculoskeletal: Normal range of motion.  Neurological: He is alert.  Skin: Skin is warm.    ED Course  Procedures (including critical care time) Labs Review Labs Reviewed - No data to display Imaging Review No results found.  EKG Interpretation   None       MDM  No diagnosis found. Pt is suppose to follow up with pain management.   I advised pt ED does not do pain management or chronic pain management.   I will given him an rx for 20 vicodin.   He is advised to discuss pain management with primary MD   Elson Areas, PA-C 09/17/13 1454

## 2013-09-23 ENCOUNTER — Ambulatory Visit (HOSPITAL_COMMUNITY)
Admission: RE | Admit: 2013-09-23 | Discharge: 2013-09-23 | Disposition: A | Discharge status: Home / Self Care | Payer: No Typology Code available for payment source | Source: Ambulatory Visit | Attending: Internal Medicine | Admitting: Internal Medicine

## 2013-09-23 ENCOUNTER — Ambulatory Visit (HOSPITAL_COMMUNITY): Admission: RE | Admit: 2013-09-23 | Payer: No Typology Code available for payment source | Source: Ambulatory Visit

## 2013-09-23 DIAGNOSIS — M79609 Pain in unspecified limb: Secondary | ICD-10-CM | POA: Insufficient documentation

## 2013-09-23 DIAGNOSIS — M79641 Pain in right hand: Secondary | ICD-10-CM

## 2013-09-23 DIAGNOSIS — M545 Low back pain, unspecified: Secondary | ICD-10-CM | POA: Insufficient documentation

## 2013-09-23 MED ORDER — GADOBENATE DIMEGLUMINE 529 MG/ML IV SOLN
20.0000 mL | Freq: Once | INTRAVENOUS | Status: AC
  Administered 2013-09-23: 20 mL via INTRAVENOUS

## 2013-09-26 ENCOUNTER — Other Ambulatory Visit: Payer: Self-pay | Admitting: Internal Medicine

## 2013-09-26 ENCOUNTER — Ambulatory Visit (HOSPITAL_COMMUNITY)
Admission: RE | Admit: 2013-09-26 | Discharge: 2013-09-26 | Disposition: A | Discharge status: Home / Self Care | Payer: No Typology Code available for payment source | Source: Ambulatory Visit | Attending: Internal Medicine | Admitting: Internal Medicine

## 2013-09-26 DIAGNOSIS — M545 Low back pain, unspecified: Secondary | ICD-10-CM

## 2013-09-26 DIAGNOSIS — R935 Abnormal findings on diagnostic imaging of other abdominal regions, including retroperitoneum: Secondary | ICD-10-CM | POA: Insufficient documentation

## 2013-09-26 DIAGNOSIS — M79641 Pain in right hand: Secondary | ICD-10-CM

## 2013-09-26 DIAGNOSIS — M79642 Pain in left hand: Secondary | ICD-10-CM

## 2013-09-26 DIAGNOSIS — R1909 Other intra-abdominal and pelvic swelling, mass and lump: Secondary | ICD-10-CM | POA: Insufficient documentation

## 2013-10-03 ENCOUNTER — Encounter (HOSPITAL_BASED_OUTPATIENT_CLINIC_OR_DEPARTMENT_OTHER): Payer: Self-pay | Admitting: Emergency Medicine

## 2013-10-03 ENCOUNTER — Emergency Department (HOSPITAL_BASED_OUTPATIENT_CLINIC_OR_DEPARTMENT_OTHER)
Admission: EM | Admit: 2013-10-03 | Discharge: 2013-10-03 | Disposition: A | Discharge status: Home / Self Care | Payer: No Typology Code available for payment source | Attending: Emergency Medicine | Admitting: Emergency Medicine

## 2013-10-03 DIAGNOSIS — G8921 Chronic pain due to trauma: Secondary | ICD-10-CM | POA: Insufficient documentation

## 2013-10-03 DIAGNOSIS — R202 Paresthesia of skin: Secondary | ICD-10-CM

## 2013-10-03 DIAGNOSIS — IMO0002 Reserved for concepts with insufficient information to code with codable children: Secondary | ICD-10-CM | POA: Insufficient documentation

## 2013-10-03 DIAGNOSIS — Z87828 Personal history of other (healed) physical injury and trauma: Secondary | ICD-10-CM | POA: Insufficient documentation

## 2013-10-03 DIAGNOSIS — Z79899 Other long term (current) drug therapy: Secondary | ICD-10-CM | POA: Insufficient documentation

## 2013-10-03 DIAGNOSIS — G8929 Other chronic pain: Secondary | ICD-10-CM

## 2013-10-03 DIAGNOSIS — R209 Unspecified disturbances of skin sensation: Secondary | ICD-10-CM | POA: Insufficient documentation

## 2013-10-03 DIAGNOSIS — M25519 Pain in unspecified shoulder: Secondary | ICD-10-CM | POA: Insufficient documentation

## 2013-10-03 MED ORDER — ACETAMINOPHEN-CODEINE #3 300-30 MG PO TABS: 1.0000 | ORAL_TABLET | Freq: Three times a day (TID) | ORAL | Status: DC | PRN

## 2013-10-03 NOTE — ED Notes (Signed)
Pt c/o bil hand pain and right shoulder pain x 5 months HX chronic pain

## 2013-10-03 NOTE — ED Provider Notes (Signed)
CSN: [id number]     Arrival date & time 10/03/13  1549 History  This chart was scribed for non-physician practitioner Elpidio Anis working with Hurman Horn, MD by Carl Best, ED Scribe. This patient was seen in room MHT13/MHT13 and the patient's care was started at 6:45 PM.      Chief Complaint  Patient presents with  . chronic pain     The history is provided by the patient. No language interpreter was used.   HPI Comments: Anthony Benjamin is a 37 y.o. male who presents to the Emergency Department complaining of bilateral hand pain and right shoulder pain radiating down his right arm that started 5 months ago after he fell from two steps.  He states that he was bedridden for two days after his fall.  He states that he has had numerous tests and MRIs of his shoulders done with his most recent one being conducted last week.  The patient states that he did not have an MRI done of his neck.  He states that his MRIs were done at [facility] and he is in the process of obtaining an appointment with pain management.  He states that he was diagnosed with Sciatica.  The patient states that he was here two weeks ago for the same symptoms.  He states that he has not been able to see a physician for his symptoms.  He states that the earliest he can get an appointment with community health is early next year.  He denies neck, chest pain, abdominal pain, SOB, and back pain as associated symptoms.    History reviewed. No pertinent past medical history. History reviewed. No pertinent past surgical history. Family History  Problem Relation Age of Onset  . Sudden death Neg Hx   . Hypertension Neg Hx   . Hyperlipidemia Neg Hx   . Heart attack Neg Hx   . Diabetes Neg Hx    History  Substance Use Topics  . Smoking status: Never Smoker   . Smokeless tobacco: Not on file  . Alcohol Use: No    Review of Systems  Respiratory: Negative for shortness of breath.   Cardiovascular: Negative for chest pain.   Gastrointestinal: Negative for abdominal pain.  Musculoskeletal: Positive for arthralgias (bilateral hands and right arm). Negative for back pain and neck pain.  All other systems reviewed and are negative.    Allergies  Aspirin and Ibuprofen  Home Medications   Current Outpatient Rx  Name  Route  Sig  Dispense  Refill  . acetaminophen-codeine (TYLENOL #3) 300-30 MG per tablet   Oral   Take 1 tablet by mouth every 8 (eight) hours as needed for moderate pain.   30 tablet   0   . ALPRAZolam (XANAX) 1 MG tablet   Oral   Take 1 tablet (1 mg total) by mouth 2 (two) times daily as needed for anxiety.   45 tablet   0   . diclofenac (VOLTAREN) 75 MG EC tablet   Oral   Take 1 tablet (75 mg total) by mouth 2 (two) times daily.   60 tablet   1   . gabapentin (NEURONTIN) 100 MG capsule   Oral   Take 1 capsule (100 mg total) by mouth 3 (three) times daily.   90 capsule   3   . HYDROcodone-acetaminophen (NORCO) 5-325 MG per tablet   Oral   Take 1 tablet by mouth every 6 (six) hours as needed for pain.   60  tablet   0   . HYDROcodone-acetaminophen (NORCO/VICODIN) 5-325 MG per tablet   Oral   Take 2 tablets by mouth every 4 (four) hours as needed.   20 tablet   0   . nortriptyline (PAMELOR) 50 MG capsule   Oral   Take 1 capsule (50 mg total) by mouth at bedtime.   30 capsule   1   . predniSONE (DELTASONE) 10 MG tablet      6,5,4,3,2,1 taper   21 tablet   0   . traMADol (ULTRAM) 50 MG tablet   Oral   Take 1 tablet (50 mg total) by mouth every 8 (eight) hours as needed for pain.   90 tablet   0    Triage Vitals: BP 128/98  Pulse 81  Temp(Src) 98 F (36.7 C) (Oral)  Resp 16  Wt 235 lb (106.595 kg)  SpO2 100%  Physical Exam  Nursing note and vitals reviewed. Constitutional: He is oriented to person, place, and time. He appears well-developed and well-nourished.  HENT:  Head: Normocephalic and atraumatic.  Right Ear: External ear normal.  Left Ear:  External ear normal.  Eyes: Conjunctivae and EOM are normal. Pupils are equal, round, and reactive to light.  Neck: Normal range of motion and phonation normal. Neck supple.  Cardiovascular: Normal rate, regular rhythm, normal heart sounds and intact distal pulses.  Exam reveals no gallop and no friction rub.   No murmur heard. Pulses equal in upper extremities.   Pulmonary/Chest: Effort normal and breath sounds normal. He exhibits no bony tenderness.  Abdominal: Soft. Normal appearance. There is no tenderness.  Musculoskeletal: Normal range of motion.  Neurological: He is alert and oriented to person, place, and time. No cranial nerve deficit or sensory deficit. He exhibits normal muscle tone. Coordination normal.  Bilateral equal grip strength without deficits.    Skin: Skin is warm, dry and intact.  Psychiatric: He has a normal mood and affect. His behavior is normal. Judgment and thought content normal.    ED Course  Procedures (including critical care time)  DIAGNOSTIC STUDIES: Oxygen Saturation is 100% on room air, normal by my interpretation.    COORDINATION OF CARE: 6:48 PM- Discussed following the same treatment plan that the patient was put under at his last visit.  The patient agreed to the treatment plan.    Labs Review Labs Reviewed - No data to display Imaging Review No results found.  EKG Interpretation   None       MDM  No diagnosis found. 1. Chronic shoulder pain 2. Paresthesias  Patient with chronic pain and no changes in presentation.   I personally performed the services described in this documentation, which was scribed in my presence. The recorded information has been reviewed and is accurate.     Arnoldo Hooker, PA-C 10/21/13 1228

## 2013-10-05 NOTE — ED Provider Notes (Signed)
Medical screening examination/treatment/procedure(s) were performed by non-physician practitioner and as supervising physician I was immediately available for consultation/collaboration.  EKG Interpretation   None        Traevon Meiring M Annely Sliva, MD 10/05/13 1415 

## 2013-10-07 ENCOUNTER — Ambulatory Visit: Payer: No Typology Code available for payment source | Attending: Internal Medicine | Admitting: Internal Medicine

## 2013-10-07 VITALS — BP 128/82 | HR 65 | Temp 98.7°F | Resp 14 | Ht 71.0 in | Wt 230.2 lb

## 2013-10-07 DIAGNOSIS — M25511 Pain in right shoulder: Secondary | ICD-10-CM

## 2013-10-07 DIAGNOSIS — R209 Unspecified disturbances of skin sensation: Secondary | ICD-10-CM | POA: Insufficient documentation

## 2013-10-07 DIAGNOSIS — M25519 Pain in unspecified shoulder: Secondary | ICD-10-CM

## 2013-10-07 MED ORDER — METHOCARBAMOL 500 MG PO TABS: 500.0000 mg | ORAL_TABLET | Freq: Three times a day (TID) | ORAL | Status: DC | PRN

## 2013-10-07 MED ORDER — MELOXICAM 7.5 MG PO TABS: 7.5000 mg | ORAL_TABLET | Freq: Two times a day (BID) | ORAL | Status: DC

## 2013-10-07 MED ORDER — TRAMADOL HCL 50 MG PO TABS: 50.0000 mg | ORAL_TABLET | Freq: Three times a day (TID) | ORAL | Status: DC | PRN

## 2013-10-07 NOTE — Progress Notes (Signed)
Pt is here for a medication review on gabapentin, xanax, and Tylenol with codeine. Requests pain management.

## 2013-10-07 NOTE — Progress Notes (Signed)
Patient ID: Anthony Benjamin, male   DOB: 1975/11/16, 37 y.o.   MRN: [id number]   CC:  HPI:  Patient is a 37 y.o. male here for low back, bilateral hand pain.  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  He fell forward and also twisted.  He continued working, finished that work day.  States by the next morning his pain was unbearable in low back and in both hands.  Went to ED at [facility] twice as well as our [facility].  Had x-rays of shoulder, low back, and left knee that were negative for fracture.  Continues to have pain in left low back radiating into left leg.  Tried tramadol and prednisone - not much change on prednisone to date.  Norco made him constipated.  Has not done any exercises, therapy for this.  Difficulty sleeping due to pain.  9/18:  Patient reports low back has improved some.  Right shoulder and pain into hands, tingling fingers has worsened.  Constant 8/10 pain.  Took prednisone, voltaren, and nortriptyline.  Doesn't feel like these have helped.  Right worse than left.  11/14  Patient had nerve conduction studies/EMG study done  He had MRI of the C-spine done which was negative  Today the patient is complaining of right shoulder pain, paresthesias in both his hands,  He also complains of bilateral flank pain as well as pain over his trochanteric bursa when he palpates it  He denies any gait instability  He also complains of a cyst in his right upper quadrant that appears to be subcutaneous with no skin involvement  He states that his diclofenac and nortriptyline have not been working  He has also tried prednisone in August with no relief    12/5 Was seen by Dr. Elisabeth Pigeon for right upper quadrant pain,, she also ordered MRI of the right shoulder    12/30  symptoms of bilateral arm numbness persists.MRI of the right shoulder reviewed shows possible small ganglion within the biceps  tendon  Allergies  Allergen Reactions  . Aspirin   . Ibuprofen     Rash   No past medical history on file. Current Outpatient Prescriptions on File Prior to Visit  Medication Sig Dispense Refill  . ALPRAZolam (XANAX) 1 MG tablet Take 1 tablet (1 mg total) by mouth 2 (two) times daily as needed for anxiety.  45 tablet  0  . HYDROcodone-acetaminophen (NORCO) 5-325 MG per tablet Take 1 tablet by mouth every 6 (six) hours as needed for pain.  60 tablet  0  . HYDROcodone-acetaminophen (NORCO/VICODIN) 5-325 MG per tablet Take 2 tablets by mouth every 4 (four) hours as needed.  20 tablet  0   No current facility-administered medications on file prior to visit.   Family History  Problem Relation Age of Onset  . Sudden death Neg Hx   . Hypertension Neg Hx   . Hyperlipidemia Neg Hx   . Heart attack Neg Hx   . Diabetes Neg Hx    History   Social History  . Marital Status: Single    Spouse Name: N/A    Number of Children: N/A  . Years of Education: N/A   Occupational History  . Not on file.   Social History Main Topics  . Smoking status: Never Smoker   . Smokeless tobacco: Not on file  . Alcohol Use: No  . Drug Use: No  .  Sexual Activity: No   Other Topics Concern  . Not on file   Social History Narrative  . No narrative on file    Review of Systems  Constitutional: Negative for fever, chills, diaphoresis, activity change, appetite change and fatigue.  HENT: Negative for ear pain, nosebleeds, congestion, facial swelling, rhinorrhea, neck pain, neck stiffness and ear discharge.   Eyes: Negative for pain, discharge, redness, itching and visual disturbance.  Respiratory: Negative for cough, choking, chest tightness, shortness of breath, wheezing and stridor.   Cardiovascular: Negative for chest pain, palpitations and leg swelling.  Gastrointestinal: Negative for abdominal distention.  Genitourinary: Negative for dysuria, urgency, frequency, hematuria, flank pain, decreased  urine volume, difficulty urinating and dyspareunia.  Musculoskeletal: Negative for back pain, joint swelling, arthralgias and gait problem.  Neurological: Negative for dizziness, tremors, seizures, syncope, facial asymmetry, speech difficulty, weakness, light-headedness, numbness and headaches.  Hematological: Negative for adenopathy. Does not bruise/bleed easily.  Psychiatric/Behavioral: Negative for hallucinations, behavioral problems, confusion, dysphoric mood, decreased concentration and agitation.    Objective:   Filed Vitals:   10/07/13 1021  BP: 128/82  Pulse: 65  Temp: 98.7 F (37.1 C)  Resp: 14    Physical Exam  Constitutional: Appears well-developed and well-nourished. No distress.  HENT: Normocephalic. External right and left ear normal. Oropharynx is clear and moist.  Eyes: Conjunctivae and EOM are normal. PERRLA, no scleral icterus.  Neck: Normal ROM. Neck supple. No JVD. No tracheal deviation. No thyromegaly.  CVS: RRR, S1/S2 +, no murmurs, no gallops, no carotid bruit.  Pulmonary: Effort and breath sounds normal, no stridor, rhonchi, wheezes, rales.  Abdominal: Soft. BS +,  no distension, tenderness, rebound or guarding.  Musculoskeletal: Normal range of motion. No edema and no tenderness.  Lymphadenopathy: No lymphadenopathy noted, cervical, inguinal. Neuro: Alert. Normal reflexes, muscle tone coordination. No cranial nerve deficit. Skin: Skin is warm and dry. No rash noted. Not diaphoretic. No erythema. No pallor.  Psychiatric: Normal mood and affect. Behavior, judgment, thought content normal.   Lab Results  Component Value Date   WBC 6.7 08/22/2013   HGB 13.6 08/22/2013   HCT 40.4 08/22/2013   MCV 75.9* 08/22/2013   PLT 270 08/22/2013   Lab Results  Component Value Date   CREATININE 0.80 06/02/2013   BUN 8 06/02/2013   NA 139 06/02/2013   K 3.6 06/02/2013   CL 100 06/02/2013   CO2 29 06/02/2013    Lab Results  Component Value Date   HGBA1C 5.9*  08/22/2013   Lipid Panel  No results found for this basename: chol, trig, hdl, cholhdl, vldl, ldlcalc       Assessment and plan:   Patient Active Problem List   Diagnosis Date Noted  . RUQ abdominal pain 09/12/2013  . Neuropathy 09/12/2013  . Low back pain 06/11/2013  . Bilateral hand pain 06/11/2013       Bilateral forearm and hand paresthesias Discuss all MRI findings including MRI of the shoulder the abdomen Physical therapy referral to see if it will help Orthopedic referral for the ganglion and the biceps tendon Pain clinic referral for chronic pain management Discontinue gabapentin, Tylenol with Codeine, nortriptyline, and Given prescription for Flexeril, meloxicam, Robaxin    The patient was given clear instructions to go to ER or return to medical center if symptoms don't improve, worsen or new problems develop. The patient verbalized understanding. The patient was told to call to get any lab results if not heard anything in the next week.

## 2013-10-21 ENCOUNTER — Ambulatory Visit: Payer: No Typology Code available for payment source | Attending: Internal Medicine | Admitting: Physical Therapy

## 2013-10-21 DIAGNOSIS — M25519 Pain in unspecified shoulder: Secondary | ICD-10-CM | POA: Insufficient documentation

## 2013-10-21 DIAGNOSIS — IMO0001 Reserved for inherently not codable concepts without codable children: Secondary | ICD-10-CM | POA: Insufficient documentation

## 2013-10-23 ENCOUNTER — Ambulatory Visit: Payer: No Typology Code available for payment source | Admitting: Rehabilitation

## 2013-10-24 ENCOUNTER — Ambulatory Visit: Payer: Self-pay

## 2013-10-24 ENCOUNTER — Ambulatory Visit: Payer: No Typology Code available for payment source | Attending: Internal Medicine

## 2013-10-24 ENCOUNTER — Ambulatory Visit: Payer: Self-pay | Admitting: Internal Medicine

## 2013-10-28 ENCOUNTER — Ambulatory Visit: Payer: No Typology Code available for payment source | Admitting: Physical Therapy

## 2013-10-29 ENCOUNTER — Ambulatory Visit: Payer: No Typology Code available for payment source | Admitting: Family Medicine

## 2013-10-30 ENCOUNTER — Ambulatory Visit: Payer: No Typology Code available for payment source | Admitting: Physical Therapy

## 2013-10-30 ENCOUNTER — Encounter (INDEPENDENT_AMBULATORY_CARE_PROVIDER_SITE_OTHER): Payer: Self-pay

## 2013-10-30 NOTE — ED Provider Notes (Signed)
Medical screening examination/treatment/procedure(s) were performed by non-physician practitioner and as supervising physician I was immediately available for consultation/collaboration.  Janaya Broy M Germaine Shenker, MD 10/30/13 1854 

## 2013-11-04 ENCOUNTER — Ambulatory Visit: Payer: No Typology Code available for payment source | Admitting: Physical Therapy

## 2013-11-06 ENCOUNTER — Ambulatory Visit: Payer: No Typology Code available for payment source | Admitting: Physical Therapy

## 2013-11-07 ENCOUNTER — Encounter: Payer: Self-pay | Admitting: Internal Medicine

## 2013-11-07 ENCOUNTER — Ambulatory Visit: Payer: No Typology Code available for payment source | Admitting: Internal Medicine

## 2013-11-07 ENCOUNTER — Ambulatory Visit: Payer: No Typology Code available for payment source | Attending: Internal Medicine | Admitting: Internal Medicine

## 2013-11-07 VITALS — BP 136/94 | HR 68 | Temp 98.7°F | Resp 14 | Ht 71.0 in | Wt 230.0 lb

## 2013-11-07 DIAGNOSIS — M79609 Pain in unspecified limb: Secondary | ICD-10-CM

## 2013-11-07 DIAGNOSIS — M79641 Pain in right hand: Secondary | ICD-10-CM

## 2013-11-07 DIAGNOSIS — G629 Polyneuropathy, unspecified: Secondary | ICD-10-CM

## 2013-11-07 DIAGNOSIS — M79642 Pain in left hand: Secondary | ICD-10-CM

## 2013-11-07 DIAGNOSIS — G589 Mononeuropathy, unspecified: Secondary | ICD-10-CM

## 2013-11-07 MED ORDER — HYDROCODONE-ACETAMINOPHEN 5-325 MG PO TABS: 2.0000 | ORAL_TABLET | Freq: Four times a day (QID) | ORAL | Status: DC | PRN

## 2013-11-07 NOTE — Progress Notes (Signed)
Pt is here for a f/u for Rrt shoulder and bilateral hand pain. Pain has improved since last visit de to physical therapy. Carpel tunnel has been ruled out. Complains of pain in bilateral hands still, Rt hand worse than the Lt. Still experiencing numbing sensation in Rt forearm. Rt shoulder has improved.

## 2013-11-07 NOTE — Progress Notes (Signed)
MRN: [id number] Name: Anthony Benjamin  Sex: male Age: 38 y.o. DOB: 06/02/1976  Allergies: Aspirin and Ibuprofen  Chief Complaint  Patient presents with  . Follow-up    shoulder, hand pain    HPI: Patient is 38 y.o. male who comes today for followup her, he was seen by Dr. Susie CassetteAbrol and already been referred to orthopedics, he also is followed up with neurology reported to have conduction study done which was normal but still has occasional paresthesia in hands, patient reports improvement in shoulder pain following up with physical therapy, is requesting some pain medication, he had already been referred to pain management and is in the process to get scheduled count denies any fever chills chest pain shortness of breath.  No past medical history on file.  No past surgical history on file.    Medication List       This list is accurate as of: 11/07/13 12:23 PM.  Always use your most recent med list.               ALPRAZolam 1 MG tablet  Commonly known as:  XANAX  Take 1 tablet (1 mg total) by mouth 2 (two) times daily as needed for anxiety.     HYDROcodone-acetaminophen 5-325 MG per tablet  Commonly known as:  NORCO  Take 1 tablet by mouth every 6 (six) hours as needed for pain.     HYDROcodone-acetaminophen 5-325 MG per tablet  Commonly known as:  NORCO/VICODIN  Take 2 tablets by mouth every 6 (six) hours as needed.     meloxicam 7.5 MG tablet  Commonly known as:  MOBIC  Take 1 tablet (7.5 mg total) by mouth 2 (two) times daily.     methocarbamol 500 MG tablet  Commonly known as:  ROBAXIN  Take 1 tablet (500 mg total) by mouth every 8 (eight) hours as needed for muscle spasms.     traMADol 50 MG tablet  Commonly known as:  ULTRAM  Take 1 tablet (50 mg total) by mouth every 8 (eight) hours as needed.        Meds ordered this encounter  Medications  . HYDROcodone-acetaminophen (NORCO/VICODIN) 5-325 MG per tablet    Sig: Take 2 tablets by mouth every 6 (six) hours  as needed.    Dispense:  30 tablet    Refill:  0    Order Specific Question:  Supervising Provider    Answer:  Eber HongMILLER, BRIAN D [[id number]]     There is no immunization history on file for this patient.  Family History  Problem Relation Age of Onset  . Sudden death Neg Hx   . Hypertension Neg Hx   . Hyperlipidemia Neg Hx   . Heart attack Neg Hx   . Diabetes Neg Hx     History  Substance Use Topics  . Smoking status: Never Smoker   . Smokeless tobacco: Not on file  . Alcohol Use: No    Review of Systems  As noted in HPI  Filed Vitals:   11/07/13 1136  BP: 136/94  Pulse: 68  Temp: 98.7 F (37.1 C)  Resp: 14    Physical Exam  Physical Exam  Constitutional: No distress.  Eyes: EOM are normal. Pupils are equal, round, and reactive to light.  Cardiovascular: Normal rate and regular rhythm.   Pulmonary/Chest: Breath sounds normal. No respiratory distress. He has no wheezes. He has no rales.  Musculoskeletal:  Minimal swelling and pain in right hand, no erythema, bilateral hand  grip OK , 2+ radial pulse     CBC    Component Value Date/Time   WBC 6.7 08/22/2013 1013   RBC 5.32 08/22/2013 1013   HGB 13.6 08/22/2013 1013   HCT 40.4 08/22/2013 1013   PLT 270 08/22/2013 1013   MCV 75.9* 08/22/2013 1013   LYMPHSABS 2.5 08/22/2013 1013   MONOABS 0.6 08/22/2013 1013   EOSABS 0.1 08/22/2013 1013   BASOSABS 0.0 08/22/2013 1013    CMP     Component Value Date/Time   NA 139 06/02/2013 1040   K 3.6 06/02/2013 1040   CL 100 06/02/2013 1040   CO2 29 06/02/2013 1040   GLUCOSE 141* 06/02/2013 1040   BUN 8 06/02/2013 1040   CREATININE 0.80 06/02/2013 1040   CALCIUM 9.5 06/02/2013 1040   GFRNONAA >90 06/02/2013 1040   GFRAA >90 06/02/2013 1040    No results found for this basename: chol,  tri,  ldl    No components found with this basename: hga1c    No results found for this basename: AST    Assessment and Plan  Bilateral hand pain - Plan: Continue with physical  therapy ,  Patient was given HYDROcodone-acetaminophen (NORCO/VICODIN) 5-325 MG per tablet prescription only use when necessary until seen by pain management.  Neuropathy Followup with neurology as needed.  Return in about 3 months (around 02/05/2014), or if symptoms worsen or fail to improve.  Doris Cheadle, MD

## 2013-11-11 ENCOUNTER — Ambulatory Visit: Payer: No Typology Code available for payment source | Attending: Internal Medicine | Admitting: Rehabilitation

## 2013-11-11 DIAGNOSIS — M25519 Pain in unspecified shoulder: Secondary | ICD-10-CM | POA: Insufficient documentation

## 2013-11-11 DIAGNOSIS — IMO0001 Reserved for inherently not codable concepts without codable children: Secondary | ICD-10-CM | POA: Insufficient documentation

## 2013-11-13 ENCOUNTER — Ambulatory Visit: Payer: No Typology Code available for payment source | Admitting: Rehabilitation

## 2013-11-18 ENCOUNTER — Ambulatory Visit: Payer: No Typology Code available for payment source | Admitting: Physical Therapy

## 2013-11-20 ENCOUNTER — Ambulatory Visit: Payer: No Typology Code available for payment source | Admitting: Rehabilitation

## 2013-11-25 ENCOUNTER — Ambulatory Visit: Payer: No Typology Code available for payment source | Admitting: Rehabilitation

## 2013-11-27 ENCOUNTER — Ambulatory Visit: Payer: No Typology Code available for payment source | Admitting: Rehabilitation

## 2013-12-02 ENCOUNTER — Ambulatory Visit: Payer: No Typology Code available for payment source | Admitting: Rehabilitation

## 2013-12-04 ENCOUNTER — Ambulatory Visit: Payer: No Typology Code available for payment source | Admitting: Physical Therapy

## 2013-12-09 ENCOUNTER — Ambulatory Visit: Payer: No Typology Code available for payment source | Attending: Internal Medicine | Admitting: Physical Therapy

## 2013-12-09 DIAGNOSIS — M25519 Pain in unspecified shoulder: Secondary | ICD-10-CM | POA: Insufficient documentation

## 2013-12-09 DIAGNOSIS — IMO0001 Reserved for inherently not codable concepts without codable children: Secondary | ICD-10-CM | POA: Insufficient documentation

## 2013-12-11 ENCOUNTER — Ambulatory Visit: Payer: No Typology Code available for payment source | Admitting: Physical Therapy

## 2013-12-16 ENCOUNTER — Ambulatory Visit: Payer: No Typology Code available for payment source | Admitting: Rehabilitation

## 2013-12-18 ENCOUNTER — Ambulatory Visit: Payer: No Typology Code available for payment source | Admitting: Physical Therapy

## 2013-12-23 ENCOUNTER — Ambulatory Visit: Payer: No Typology Code available for payment source | Admitting: Physical Therapy

## 2013-12-25 ENCOUNTER — Ambulatory Visit: Payer: No Typology Code available for payment source | Admitting: Rehabilitation

## 2013-12-30 ENCOUNTER — Ambulatory Visit: Payer: Self-pay | Admitting: Physical Therapy

## 2014-01-01 ENCOUNTER — Ambulatory Visit: Payer: No Typology Code available for payment source | Admitting: Physical Therapy

## 2014-01-06 ENCOUNTER — Ambulatory Visit: Payer: No Typology Code available for payment source | Admitting: Rehabilitation

## 2014-01-08 ENCOUNTER — Ambulatory Visit: Payer: No Typology Code available for payment source | Admitting: Physical Therapy

## 2014-01-13 ENCOUNTER — Ambulatory Visit: Payer: No Typology Code available for payment source | Attending: Internal Medicine | Admitting: Rehabilitation

## 2014-01-13 DIAGNOSIS — M25519 Pain in unspecified shoulder: Secondary | ICD-10-CM | POA: Insufficient documentation

## 2014-01-13 DIAGNOSIS — IMO0001 Reserved for inherently not codable concepts without codable children: Secondary | ICD-10-CM | POA: Insufficient documentation

## 2014-01-20 ENCOUNTER — Ambulatory Visit: Payer: No Typology Code available for payment source | Admitting: Physical Therapy

## 2014-01-22 ENCOUNTER — Ambulatory Visit: Payer: No Typology Code available for payment source | Admitting: Physical Therapy

## 2014-01-27 ENCOUNTER — Ambulatory Visit: Payer: No Typology Code available for payment source | Admitting: Physical Therapy

## 2014-01-29 ENCOUNTER — Ambulatory Visit: Payer: No Typology Code available for payment source | Admitting: Rehabilitation

## 2014-02-03 ENCOUNTER — Ambulatory Visit: Payer: No Typology Code available for payment source | Admitting: Physical Therapy

## 2014-02-05 ENCOUNTER — Ambulatory Visit: Payer: No Typology Code available for payment source | Admitting: Rehabilitation

## 2014-02-06 ENCOUNTER — Ambulatory Visit: Payer: Self-pay | Admitting: Physical Medicine & Rehabilitation

## 2014-02-09 ENCOUNTER — Ambulatory Visit: Payer: No Typology Code available for payment source | Attending: Internal Medicine | Admitting: Internal Medicine

## 2014-02-09 ENCOUNTER — Encounter: Payer: Self-pay | Admitting: Internal Medicine

## 2014-02-09 VITALS — BP 130/80 | HR 79 | Temp 99.0°F | Resp 17 | Wt 233.4 lb

## 2014-02-09 DIAGNOSIS — M545 Low back pain, unspecified: Secondary | ICD-10-CM

## 2014-02-09 DIAGNOSIS — M25519 Pain in unspecified shoulder: Secondary | ICD-10-CM | POA: Insufficient documentation

## 2014-02-09 DIAGNOSIS — R7301 Impaired fasting glucose: Secondary | ICD-10-CM | POA: Insufficient documentation

## 2014-02-09 DIAGNOSIS — G589 Mononeuropathy, unspecified: Secondary | ICD-10-CM

## 2014-02-09 DIAGNOSIS — M79609 Pain in unspecified limb: Secondary | ICD-10-CM | POA: Insufficient documentation

## 2014-02-09 DIAGNOSIS — Z79899 Other long term (current) drug therapy: Secondary | ICD-10-CM | POA: Insufficient documentation

## 2014-02-09 DIAGNOSIS — G629 Polyneuropathy, unspecified: Secondary | ICD-10-CM

## 2014-02-09 NOTE — Progress Notes (Signed)
MRN: [id number] Name: Anthony Benjamin  Sex: male Age: 38 y.o. DOB: 10/25/1975  Allergies: Aspirin and Ibuprofen  Chief Complaint  Patient presents with  . Follow-up    HPI: Patient is 38 y.o. male who comes today for followup history of shoulder pain bilateral hand pain, patient also up in the past and reports improvement in the symptoms, still he has paresthesias in his right forearm, in the past he was referred to neurology, patient is requesting another referral, patient also has lower back pain denies any incontinence currently not taking any medication requesting to see physical therapy for this problem. Previous blood work reviewed noticed impaired fasting glucose hemoglobin A1c of 5.9%, patient denies any family history of diabetes.  History reviewed. No pertinent past medical history.  History reviewed. No pertinent past surgical history.    Medication List       This list is accurate as of: 02/09/14  3:06 PM.  Always use your most recent med list.               ALPRAZolam 1 MG tablet  Commonly known as:  XANAX  Take 1 tablet (1 mg total) by mouth 2 (two) times daily as needed for anxiety.     HYDROcodone-acetaminophen 5-325 MG per tablet  Commonly known as:  NORCO  Take 1 tablet by mouth every 6 (six) hours as needed for pain.     HYDROcodone-acetaminophen 5-325 MG per tablet  Commonly known as:  NORCO/VICODIN  Take 2 tablets by mouth every 6 (six) hours as needed.     meloxicam 7.5 MG tablet  Commonly known as:  MOBIC  Take 1 tablet (7.5 mg total) by mouth 2 (two) times daily.     methocarbamol 500 MG tablet  Commonly known as:  ROBAXIN  Take 1 tablet (500 mg total) by mouth every 8 (eight) hours as needed for muscle spasms.     traMADol 50 MG tablet  Commonly known as:  ULTRAM  Take 1 tablet (50 mg total) by mouth every 8 (eight) hours as needed.        No orders of the defined types were placed in this encounter.     There is no immunization  history on file for this patient.  Family History  Problem Relation Age of Onset  . Sudden death Neg Hx   . Hypertension Neg Hx   . Hyperlipidemia Neg Hx   . Heart attack Neg Hx   . Diabetes Neg Hx     History  Substance Use Topics  . Smoking status: Never Smoker   . Smokeless tobacco: Not on file  . Alcohol Use: No    Review of Systems   As noted in HPI  Filed Vitals:   02/09/14 1459  BP: 130/80  Pulse:   Temp:   Resp:     Physical Exam  Physical Exam  Eyes: EOM are normal. Pupils are equal, round, and reactive to light.  Cardiovascular: Normal rate and regular rhythm.   Pulmonary/Chest: Breath sounds normal. No respiratory distress. He has no wheezes. He has no rales.  Musculoskeletal:  Left Lower lumbar paraspinal tenderness, SLR test negative equal strength both lower extremities.     CBC    Component Value Date/Time   WBC 6.7 08/22/2013 1013   RBC 5.32 08/22/2013 1013   HGB 13.6 08/22/2013 1013   HCT 40.4 08/22/2013 1013   PLT 270 08/22/2013 1013   MCV 75.9* 08/22/2013 1013   LYMPHSABS 2.5  08/22/2013 1013   MONOABS 0.6 08/22/2013 1013   EOSABS 0.1 08/22/2013 1013   BASOSABS 0.0 08/22/2013 1013    CMP     Component Value Date/Time   NA 139 06/02/2013 1040   K 3.6 06/02/2013 1040   CL 100 06/02/2013 1040   CO2 29 06/02/2013 1040   GLUCOSE 141* 06/02/2013 1040   BUN 8 06/02/2013 1040   CREATININE 0.80 06/02/2013 1040   CALCIUM 9.5 06/02/2013 1040   GFRNONAA >90 06/02/2013 1040   GFRAA >90 06/02/2013 1040    No results found for this basename: chol, tri, ldl    No components found with this basename: hga1c    No results found for this basename: AST    Assessment and Plan  Low back pain - Plan: I have advised patient to apply heating pad, also Ambulatory referral to Physical Therapy  Neuropathy - Plan: Ambulatory referral to Neurology  IFG (impaired fasting glucose) Advised patient for low carbohydrate diet. Will repeat hemoglobin A1c on  the next visit.   Return in about 4 months (around 06/12/2014) for IFG, back pain.  Doris Cheadleeepak Davisha Linthicum, MD

## 2014-02-09 NOTE — Progress Notes (Signed)
Patient here for follow up States is done with physical therapy but still having issues with pain to His hands and his sciatica Would like referral to neurology and physical therapy

## 2014-02-09 NOTE — Patient Instructions (Signed)

## 2014-02-17 ENCOUNTER — Ambulatory Visit: Payer: No Typology Code available for payment source | Attending: Internal Medicine | Admitting: Physical Therapy

## 2014-02-17 DIAGNOSIS — M545 Low back pain, unspecified: Secondary | ICD-10-CM | POA: Insufficient documentation

## 2014-02-17 DIAGNOSIS — IMO0001 Reserved for inherently not codable concepts without codable children: Secondary | ICD-10-CM | POA: Insufficient documentation

## 2014-02-19 ENCOUNTER — Ambulatory Visit: Payer: No Typology Code available for payment source | Admitting: Rehabilitation

## 2014-02-24 ENCOUNTER — Ambulatory Visit: Payer: No Typology Code available for payment source | Admitting: Rehabilitation

## 2014-02-26 ENCOUNTER — Ambulatory Visit: Payer: No Typology Code available for payment source | Admitting: Physical Therapy

## 2014-03-04 ENCOUNTER — Ambulatory Visit: Payer: No Typology Code available for payment source | Admitting: Rehabilitation

## 2014-03-09 ENCOUNTER — Ambulatory Visit: Payer: No Typology Code available for payment source | Attending: Internal Medicine | Admitting: Rehabilitation

## 2014-03-09 DIAGNOSIS — IMO0001 Reserved for inherently not codable concepts without codable children: Secondary | ICD-10-CM | POA: Insufficient documentation

## 2014-03-09 DIAGNOSIS — M25519 Pain in unspecified shoulder: Secondary | ICD-10-CM | POA: Insufficient documentation

## 2014-03-12 ENCOUNTER — Ambulatory Visit: Payer: No Typology Code available for payment source | Admitting: Physical Therapy

## 2014-03-16 ENCOUNTER — Ambulatory Visit: Payer: No Typology Code available for payment source | Admitting: Rehabilitation

## 2014-03-17 ENCOUNTER — Ambulatory Visit: Payer: No Typology Code available for payment source | Admitting: Neurology

## 2014-03-19 ENCOUNTER — Ambulatory Visit: Payer: No Typology Code available for payment source | Admitting: Physical Therapy

## 2014-03-25 ENCOUNTER — Ambulatory Visit: Payer: No Typology Code available for payment source | Admitting: Rehabilitation

## 2014-03-27 ENCOUNTER — Ambulatory Visit: Payer: No Typology Code available for payment source | Admitting: Physical Therapy

## 2014-03-31 ENCOUNTER — Ambulatory Visit: Payer: No Typology Code available for payment source

## 2014-04-02 ENCOUNTER — Ambulatory Visit: Payer: No Typology Code available for payment source

## 2014-05-05 ENCOUNTER — Encounter: Payer: Self-pay | Admitting: Internal Medicine

## 2014-05-05 ENCOUNTER — Ambulatory Visit: Payer: No Typology Code available for payment source | Attending: Internal Medicine | Admitting: Internal Medicine

## 2014-05-05 VITALS — BP 152/91 | HR 73 | Temp 98.2°F | Resp 16 | Wt 239.0 lb

## 2014-05-05 DIAGNOSIS — J329 Chronic sinusitis, unspecified: Secondary | ICD-10-CM

## 2014-05-05 DIAGNOSIS — J3489 Other specified disorders of nose and nasal sinuses: Secondary | ICD-10-CM | POA: Insufficient documentation

## 2014-05-05 DIAGNOSIS — R0981 Nasal congestion: Secondary | ICD-10-CM

## 2014-05-05 DIAGNOSIS — R51 Headache: Secondary | ICD-10-CM | POA: Insufficient documentation

## 2014-05-05 MED ORDER — FLUTICASONE PROPIONATE 50 MCG/ACT NA SUSP: 2.0000 | Freq: Every day | NASAL | Status: DC

## 2014-05-05 MED ORDER — BUTALBITAL-APAP-CAFFEINE 50-325-40 MG PO TABS: 1.0000 | ORAL_TABLET | Freq: Four times a day (QID) | ORAL | Status: DC | PRN

## 2014-05-05 MED ORDER — DOXYCYCLINE HYCLATE 100 MG PO TABS: 100.0000 mg | ORAL_TABLET | Freq: Two times a day (BID) | ORAL | Status: DC

## 2014-05-05 NOTE — Progress Notes (Signed)
MRN: [id number] Name: Anthony Benjamin  Sex: male Age: 38 y.o. DOB: 1975-12-01  Allergies: Aspirin and Ibuprofen  Chief Complaint  Patient presents with  . Headache    HPI: Patient is 38 y.o. male who comes today reported to have lot of sinus pressure nasal congestion, as per patient he has tried over-the-counter antiallergy medications without much improvement denies any fever chills chest pain shortness of breath, patient denies smoking cigarettes. Patient also denies any family history of migraine headaches but has been having lot of frontal headache.   History reviewed. No pertinent past medical history.  History reviewed. No pertinent past surgical history.    Medication List       This list is accurate as of: 05/05/14  5:48 PM.  Always use your most recent med list.               ALPRAZolam 1 MG tablet  Commonly known as:  XANAX  Take 1 tablet (1 mg total) by mouth 2 (two) times daily as needed for anxiety.     butalbital-acetaminophen-caffeine 50-325-40 MG per tablet  Commonly known as:  ESGIC  Take 1 tablet by mouth every 6 (six) hours as needed for headache.     doxycycline 100 MG tablet  Commonly known as:  VIBRA-TABS  Take 1 tablet (100 mg total) by mouth 2 (two) times daily.     fluticasone 50 MCG/ACT nasal spray  Commonly known as:  FLONASE  Place 2 sprays into both nostrils daily.     HYDROcodone-acetaminophen 5-325 MG per tablet  Commonly known as:  NORCO  Take 1 tablet by mouth every 6 (six) hours as needed for pain.     HYDROcodone-acetaminophen 5-325 MG per tablet  Commonly known as:  NORCO/VICODIN  Take 2 tablets by mouth every 6 (six) hours as needed.     meloxicam 7.5 MG tablet  Commonly known as:  MOBIC  Take 1 tablet (7.5 mg total) by mouth 2 (two) times daily.     methocarbamol 500 MG tablet  Commonly known as:  ROBAXIN  Take 1 tablet (500 mg total) by mouth every 8 (eight) hours as needed for muscle spasms.     traMADol 50 MG  tablet  Commonly known as:  ULTRAM  Take 1 tablet (50 mg total) by mouth every 8 (eight) hours as needed.        Meds ordered this encounter  Medications  . butalbital-acetaminophen-caffeine (ESGIC) 50-325-40 MG per tablet    Sig: Take 1 tablet by mouth every 6 (six) hours as needed for headache.    Dispense:  30 tablet    Refill:  0  . doxycycline (VIBRA-TABS) 100 MG tablet    Sig: Take 1 tablet (100 mg total) by mouth 2 (two) times daily.    Dispense:  20 tablet    Refill:  0  . fluticasone (FLONASE) 50 MCG/ACT nasal spray    Sig: Place 2 sprays into both nostrils daily.    Dispense:  16 g    Refill:  1     There is no immunization history on file for this patient.  Family History  Problem Relation Age of Onset  . Sudden death Neg Hx   . Hypertension Neg Hx   . Hyperlipidemia Neg Hx   . Heart attack Neg Hx   . Diabetes Neg Hx     History  Substance Use Topics  . Smoking status: Never Smoker   . Smokeless tobacco: Not on  file  . Alcohol Use: No    Review of Systems   As noted in HPI  Filed Vitals:   05/05/14 1707  BP: 152/91  Pulse: 73  Temp: 98.2 F (36.8 C)  Resp: 16    Physical Exam  Physical Exam  Constitutional: He is oriented to person, place, and time. No distress.  HENT:  Nasal congestion sinus tenderness, minima pharyngeal erythema no exudate   Eyes: EOM are normal. Pupils are equal, round, and reactive to light.  Cardiovascular: Normal rate and regular rhythm.   Pulmonary/Chest: Breath sounds normal. No respiratory distress. He has no wheezes. He has no rales.  Neurological: He is alert and oriented to person, place, and time.    CBC    Component Value Date/Time   WBC 6.7 08/22/2013 1013   RBC 5.32 08/22/2013 1013   HGB 13.6 08/22/2013 1013   HCT 40.4 08/22/2013 1013   PLT 270 08/22/2013 1013   MCV 75.9* 08/22/2013 1013   LYMPHSABS 2.5 08/22/2013 1013   MONOABS 0.6 08/22/2013 1013   EOSABS 0.1 08/22/2013 1013   BASOSABS 0.0  08/22/2013 1013    CMP     Component Value Date/Time   NA 139 06/02/2013 1040   K 3.6 06/02/2013 1040   CL 100 06/02/2013 1040   CO2 29 06/02/2013 1040   GLUCOSE 141* 06/02/2013 1040   BUN 8 06/02/2013 1040   CREATININE 0.80 06/02/2013 1040   CALCIUM 9.5 06/02/2013 1040   GFRNONAA >90 06/02/2013 1040   GFRAA >90 06/02/2013 1040    No results found for this basename: chol, tri, ldl    No components found with this basename: hga1c    No results found for this basename: AST    Assessment and Plan  Unspecified sinusitis (chronic) - Plan: doxycycline (VIBRA-TABS) 100 MG tablet  Headache(784.0) - Plan: butalbital-acetaminophen-caffeine (ESGIC) 50-325-40 MG per tablet  Nasal congestion - Plan: fluticasone (FLONASE) 50 MCG/ACT nasal spray    Return if symptoms worsen or fail to improve.  Doris CheadleADVANI, Susanna Benge, MD

## 2014-05-05 NOTE — Progress Notes (Signed)
Patient complains of having headaches just about Every day for the past two months Complains of sinus pressure as well

## 2014-05-20 ENCOUNTER — Telehealth: Payer: Self-pay | Admitting: Internal Medicine

## 2014-05-20 NOTE — Telephone Encounter (Signed)
(  please refer to prior note) Pls contact pt. On his mobile # [phone].  Thank you.

## 2014-05-20 NOTE — Telephone Encounter (Signed)
Pt was seen in the office on 05/05/14. Pt states he is still experiencing some of the same symptoms from this visit (sneezing coughing.. No headache) Pls contact pt.

## 2014-05-21 ENCOUNTER — Telehealth: Payer: Self-pay | Admitting: Internal Medicine

## 2014-05-21 NOTE — Telephone Encounter (Signed)
Pt feels Flonase doesn't help with his allergies Please f/u wit Patient

## 2014-05-22 ENCOUNTER — Telehealth: Payer: Self-pay | Admitting: Internal Medicine

## 2014-05-22 NOTE — Telephone Encounter (Signed)
Patient also states he still coughing and sneezing? Patient states the Flonase helps but not significantly. Patient states his cough is dry. Patient states he has tried Careers adviserAllegra, Zyrtec, and Claritin.   Please advise

## 2014-05-22 NOTE — Telephone Encounter (Signed)
Patient called stating flonase is not helping. Can we prescribe something else?

## 2014-05-22 NOTE — Telephone Encounter (Signed)
Pt. Calling again stating that he is still

## 2014-06-12 ENCOUNTER — Ambulatory Visit: Payer: Self-pay | Admitting: Internal Medicine

## 2014-09-07 ENCOUNTER — Telehealth: Payer: Self-pay | Admitting: Internal Medicine

## 2014-09-07 NOTE — Telephone Encounter (Signed)
Case manager called requesting a refill for a pt for ALPRAZolam (XANAX) 1 MG tablet

## 2014-09-08 NOTE — Telephone Encounter (Signed)
Pt following up on refill request. Please f/u with pt.

## 2014-09-09 ENCOUNTER — Other Ambulatory Visit: Payer: Self-pay

## 2014-09-09 ENCOUNTER — Telehealth: Payer: Self-pay

## 2014-09-09 NOTE — Telephone Encounter (Signed)
Patient called requesting refill on his medication for xanax Last written my Dr Lovett CalenderMyers-Is this something you will refill for patient?

## 2014-09-11 ENCOUNTER — Telehealth: Payer: Self-pay

## 2014-09-11 NOTE — Telephone Encounter (Signed)
Returned patient call  Patient not available Left message on voice mail to return our call 

## 2014-09-11 NOTE — Telephone Encounter (Signed)
Pt returning nurse's call, please f/u with pt.   °

## 2014-09-11 NOTE — Telephone Encounter (Signed)
Pharmacy called -patient requesting a refill on his xanax Patient has not had medication filled since December 2014 Patient will need to follow up with primary to have medication refilled

## 2014-09-14 ENCOUNTER — Telehealth: Payer: Self-pay

## 2014-09-14 NOTE — Telephone Encounter (Signed)
Patient is aware he needs to make a follow up appointment

## 2014-09-30 ENCOUNTER — Encounter: Payer: Self-pay | Admitting: Internal Medicine

## 2014-09-30 ENCOUNTER — Ambulatory Visit: Payer: No Typology Code available for payment source | Attending: Internal Medicine | Admitting: Internal Medicine

## 2014-09-30 VITALS — BP 135/83 | HR 76 | Temp 98.0°F | Resp 16 | Wt 239.0 lb

## 2014-09-30 DIAGNOSIS — R7303 Prediabetes: Secondary | ICD-10-CM

## 2014-09-30 DIAGNOSIS — F411 Generalized anxiety disorder: Secondary | ICD-10-CM | POA: Insufficient documentation

## 2014-09-30 DIAGNOSIS — Z79899 Other long term (current) drug therapy: Secondary | ICD-10-CM | POA: Insufficient documentation

## 2014-09-30 DIAGNOSIS — R7309 Other abnormal glucose: Secondary | ICD-10-CM | POA: Insufficient documentation

## 2014-09-30 DIAGNOSIS — R1900 Intra-abdominal and pelvic swelling, mass and lump, unspecified site: Secondary | ICD-10-CM

## 2014-09-30 DIAGNOSIS — R202 Paresthesia of skin: Secondary | ICD-10-CM

## 2014-09-30 DIAGNOSIS — R1901 Right upper quadrant abdominal swelling, mass and lump: Secondary | ICD-10-CM | POA: Insufficient documentation

## 2014-09-30 DIAGNOSIS — R51 Headache: Secondary | ICD-10-CM | POA: Insufficient documentation

## 2014-09-30 LAB — POCT GLYCOSYLATED HEMOGLOBIN (HGB A1C): Hemoglobin A1C: 6.1

## 2014-09-30 MED ORDER — ALPRAZOLAM 1 MG PO TABS: 1.0000 mg | ORAL_TABLET | Freq: Every evening | ORAL | Status: DC | PRN

## 2014-09-30 NOTE — Progress Notes (Signed)
MRN: [id number] Name: Anthony Benjamin  Sex: male Age: 38 y.o. DOB: 07/23/1976  Allergies: Aspirin and Ibuprofen  Chief Complaint  Patient presents with  . Headache    HPI: Patient is 38 y.o. male who has history of neuropathy numbness tingling in his hands her, he had these symptoms last year had MRI done which were negative as per patient he also saw a neurologist and had enough conduction study done which was negative, as per patient his symptoms are better but still has occasional numbness tingling, denies any fever chills, any weakness, patient also history of anxiety and is requesting refill on Xanax which helped him with his symptoms since recently has been under a lot of stress, he also has history of lump in his abdomen, had ultrasound done last year, as per patient sometimes it becomes painful. In the past noticed his blood sugar was elevated, today his hemoglobin A1c checked is 6.1%.  History reviewed. No pertinent past medical history.  History reviewed. No pertinent past surgical history.    Medication List       This list is accurate as of: 09/30/14  5:06 PM.  Always use your most recent med list.               ALPRAZolam 1 MG tablet  Commonly known as:  XANAX  Take 1 tablet (1 mg total) by mouth at bedtime as needed for anxiety.     butalbital-acetaminophen-caffeine 50-325-40 MG per tablet  Commonly known as:  ESGIC  Take 1 tablet by mouth every 6 (six) hours as needed for headache.     doxycycline 100 MG tablet  Commonly known as:  VIBRA-TABS  Take 1 tablet (100 mg total) by mouth 2 (two) times daily.     fluticasone 50 MCG/ACT nasal spray  Commonly known as:  FLONASE  Place 2 sprays into both nostrils daily.     HYDROcodone-acetaminophen 5-325 MG per tablet  Commonly known as:  NORCO  Take 1 tablet by mouth every 6 (six) hours as needed for pain.     HYDROcodone-acetaminophen 5-325 MG per tablet  Commonly known as:  NORCO/VICODIN  Take 2 tablets  by mouth every 6 (six) hours as needed.     meloxicam 7.5 MG tablet  Commonly known as:  MOBIC  Take 1 tablet (7.5 mg total) by mouth 2 (two) times daily.     methocarbamol 500 MG tablet  Commonly known as:  ROBAXIN  Take 1 tablet (500 mg total) by mouth every 8 (eight) hours as needed for muscle spasms.     traMADol 50 MG tablet  Commonly known as:  ULTRAM  Take 1 tablet (50 mg total) by mouth every 8 (eight) hours as needed.        Meds ordered this encounter  Medications  . ALPRAZolam (XANAX) 1 MG tablet    Sig: Take 1 tablet (1 mg total) by mouth at bedtime as needed for anxiety.    Dispense:  30 tablet    Refill:  0     There is no immunization history on file for this patient.  Family History  Problem Relation Age of Onset  . Sudden death Neg Hx   . Hypertension Neg Hx   . Hyperlipidemia Neg Hx   . Heart attack Neg Hx   . Diabetes Neg Hx     History  Substance Use Topics  . Smoking status: Never Smoker   . Smokeless tobacco: Not on file  .  Alcohol Use: No    Review of Systems   As noted in HPI  Filed Vitals:   09/30/14 1622  BP: 135/83  Pulse: 76  Temp: 98 F (36.7 C)  Resp: 16    Physical Exam  Physical Exam  Constitutional: No distress.  Eyes: EOM are normal. Pupils are equal, round, and reactive to light.  Cardiovascular: Normal rate and regular rhythm.   Pulmonary/Chest: Breath sounds normal. No respiratory distress. He has no wheezes. He has no rales.  Abdominal: There is no rebound and no guarding.  RUQ s/c palpable lump non tender     CBC    Component Value Date/Time   WBC 6.7 08/22/2013 1013   RBC 5.32 08/22/2013 1013   HGB 13.6 08/22/2013 1013   HCT 40.4 08/22/2013 1013   PLT 270 08/22/2013 1013   MCV 75.9* 08/22/2013 1013   LYMPHSABS 2.5 08/22/2013 1013   MONOABS 0.6 08/22/2013 1013   EOSABS 0.1 08/22/2013 1013   BASOSABS 0.0 08/22/2013 1013    CMP     Component Value Date/Time   NA 139 06/02/2013 1040   K 3.6  06/02/2013 1040   CL 100 06/02/2013 1040   CO2 29 06/02/2013 1040   GLUCOSE 141* 06/02/2013 1040   BUN 8 06/02/2013 1040   CREATININE 0.80 06/02/2013 1040   CALCIUM 9.5 06/02/2013 1040   GFRNONAA >90 06/02/2013 1040   GFRAA >90 06/02/2013 1040    No results found for: CHOL  No components found for: HGA1C  No results found for: AST  Assessment and Plan  Right hand paresthesia - Plan: patient had MRI is done nerve conduction study done which was negative will checkVitamin B12  Prediabetes Results for orders placed or performed in visit on 09/30/14  HgB A1c  Result Value Ref Range   Hemoglobin A1C 6.1    Advised patient for diabetes meal planning, will repeat A1c in 3 months  Anxiety state - Plan: ALPRAZolam (XANAX) 1 MG tablet  Abdominal lump - Plan: Ambulatory referral to General Surgery   Health Maintenance Patient declines flu shot   Return in about 3 months (around 12/30/2014), or if symptoms worsen or fail to improve.  Doris CheadleADVANI, Aeriana Speece, MD

## 2014-09-30 NOTE — Progress Notes (Signed)
Patient here for follow up Complains of headaches on and off Having bilateral hand pain and pain to his right arm Patient also states he has a "pea"size lump in his right upper abd area that is Tender to the touch

## 2014-09-30 NOTE — Patient Instructions (Signed)
Diabetes Mellitus and Food It is important for you to manage your blood sugar (glucose) level. Your blood glucose level can be greatly affected by what you eat. Eating healthier foods in the appropriate amounts throughout the day at about the same time each day will help you control your blood glucose level. It can also help slow or prevent worsening of your diabetes mellitus. Healthy eating may even help you improve the level of your blood pressure and reach or maintain a healthy weight.  HOW CAN FOOD AFFECT ME? Carbohydrates Carbohydrates affect your blood glucose level more than any other type of food. Your dietitian will help you determine how many carbohydrates to eat at each meal and teach you how to count carbohydrates. Counting carbohydrates is important to keep your blood glucose at a healthy level, especially if you are using insulin or taking certain medicines for diabetes mellitus. Alcohol Alcohol can cause sudden decreases in blood glucose (hypoglycemia), especially if you use insulin or take certain medicines for diabetes mellitus. Hypoglycemia can be a life-threatening condition. Symptoms of hypoglycemia (sleepiness, dizziness, and disorientation) are similar to symptoms of having too much alcohol.  If your health care provider has given you approval to drink alcohol, do so in moderation and use the following guidelines:  Women should not have more than one drink per day, and men should not have more than two drinks per day. One drink is equal to:  12 oz of beer.  5 oz of wine.  1 oz of hard liquor.  Do not drink on an empty stomach.  Keep yourself hydrated. Have water, diet soda, or unsweetened iced tea.  Regular soda, juice, and other mixers might contain a lot of carbohydrates and should be counted. WHAT FOODS ARE NOT RECOMMENDED? As you make food choices, it is important to remember that all foods are not the same. Some foods have fewer nutrients per serving than other  foods, even though they might have the same number of calories or carbohydrates. It is difficult to get your body what it needs when you eat foods with fewer nutrients. Examples of foods that you should avoid that are high in calories and carbohydrates but low in nutrients include:  Trans fats (most processed foods list trans fats on the Nutrition Facts label).  Regular soda.  Juice.  Candy.  Sweets, such as cake, pie, doughnuts, and cookies.  Fried foods. WHAT FOODS CAN I EAT? Have nutrient-rich foods, which will nourish your body and keep you healthy. The food you should eat also will depend on several factors, including:  The calories you need.  The medicines you take.  Your weight.  Your blood glucose level.  Your blood pressure level.  Your cholesterol level. You also should eat a variety of foods, including:  Protein, such as meat, poultry, fish, tofu, nuts, and seeds (lean animal proteins are best).  Fruits.  Vegetables.  Dairy products, such as milk, cheese, and yogurt (low fat is best).  Breads, grains, pasta, cereal, rice, and beans.  Fats such as olive oil, trans fat-free margarine, canola oil, avocado, and olives. DOES EVERYONE WITH DIABETES MELLITUS HAVE THE SAME MEAL PLAN? Because every person with diabetes mellitus is different, there is not one meal plan that works for everyone. It is very important that you meet with a dietitian who will help you create a meal plan that is just right for you. Document Released: 06/22/2005 Document Revised: 09/30/2013 Document Reviewed: 08/22/2013 [employer] Patient Information 2015 [employer]. This   information is not intended to replace advice given to you by your health care provider. Make sure you discuss any questions you have with your health care provider.  

## 2014-10-01 LAB — VITAMIN B12: VITAMIN B 12: 838 pg/mL (ref 211–911)

## 2014-10-08 ENCOUNTER — Telehealth: Payer: Self-pay

## 2014-10-08 NOTE — Telephone Encounter (Signed)
Patient not available Message left on voice mail that all blood work was normal

## 2014-10-08 NOTE — Telephone Encounter (Signed)
-----   Message from Doris Cheadleeepak Advani, MD sent at 10/05/2014 10:13 AM EST ----- Call and let the Patient know that blood work is normal.

## 2014-10-19 ENCOUNTER — Other Ambulatory Visit: Payer: Self-pay | Admitting: Internal Medicine

## 2015-01-31 ENCOUNTER — Emergency Department (HOSPITAL_BASED_OUTPATIENT_CLINIC_OR_DEPARTMENT_OTHER)
Admission: EM | Admit: 2015-01-31 | Discharge: 2015-01-31 | Disposition: A | Discharge status: Home / Self Care | Payer: BLUE CROSS/BLUE SHIELD | Attending: Emergency Medicine | Admitting: Emergency Medicine

## 2015-01-31 ENCOUNTER — Encounter (HOSPITAL_BASED_OUTPATIENT_CLINIC_OR_DEPARTMENT_OTHER): Payer: Self-pay | Admitting: *Deleted

## 2015-01-31 ENCOUNTER — Emergency Department (HOSPITAL_BASED_OUTPATIENT_CLINIC_OR_DEPARTMENT_OTHER): Payer: BLUE CROSS/BLUE SHIELD

## 2015-01-31 DIAGNOSIS — Z79899 Other long term (current) drug therapy: Secondary | ICD-10-CM | POA: Insufficient documentation

## 2015-01-31 DIAGNOSIS — Y998 Other external cause status: Secondary | ICD-10-CM | POA: Insufficient documentation

## 2015-01-31 DIAGNOSIS — S8991XA Unspecified injury of right lower leg, initial encounter: Secondary | ICD-10-CM | POA: Diagnosis present

## 2015-01-31 DIAGNOSIS — Y92310 Basketball court as the place of occurrence of the external cause: Secondary | ICD-10-CM | POA: Diagnosis not present

## 2015-01-31 DIAGNOSIS — S8391XA Sprain of unspecified site of right knee, initial encounter: Secondary | ICD-10-CM | POA: Insufficient documentation

## 2015-01-31 DIAGNOSIS — Y9367 Activity, basketball: Secondary | ICD-10-CM | POA: Insufficient documentation

## 2015-01-31 DIAGNOSIS — Z7951 Long term (current) use of inhaled steroids: Secondary | ICD-10-CM | POA: Diagnosis not present

## 2015-01-31 DIAGNOSIS — X58XXXA Exposure to other specified factors, initial encounter: Secondary | ICD-10-CM | POA: Diagnosis not present

## 2015-01-31 DIAGNOSIS — M25461 Effusion, right knee: Secondary | ICD-10-CM

## 2015-01-31 MED ORDER — HYDROCODONE-ACETAMINOPHEN 5-325 MG PO TABS: 1.0000 | ORAL_TABLET | Freq: Four times a day (QID) | ORAL | Status: DC | PRN

## 2015-01-31 NOTE — ED Notes (Signed)
Pt injured his right knee playing basketball.  Pain and swelling

## 2015-01-31 NOTE — ED Provider Notes (Signed)
CSN: [id number]     Arrival date & time 01/31/15  1849 History  This chart was scribed for Anthony SproutWhitney Jasamine Pottinger, MD by Ronney LionSuzanne Le, ED Scribe. This patient was seen in room MHFT1/MHFT1 and the patient's care was started at 9:27 PM.    Chief Complaint  Patient presents with  . Knee Injury   The history is provided by the patient. No language interpreter was used.    HPI Comments: Anthony Benjamin is a 39 y.o. male with no significant PMHx who presents to the Emergency Department complaining of constant, moderate right knee pain, mostly lateral, following an injury of unknown mechanism that occurred PTA when patient made a jump shot while playing basketball. Patient states he is unable to ambulate secondary to pain. Sitting and elevating it significantly alleviate the pain. Patient works as a Oceanographerconsumer service manager, which isn't strenuous. He denies having an orthopedist. Patient has allergies to ibuprofen.  History reviewed. No pertinent past medical history. History reviewed. No pertinent past surgical history. Family History  Problem Relation Age of Onset  . Sudden death Neg Hx   . Hypertension Neg Hx   . Hyperlipidemia Neg Hx   . Heart attack Neg Hx   . Diabetes Neg Hx    History  Substance Use Topics  . Smoking status: Never Smoker   . Smokeless tobacco: Not on file  . Alcohol Use: No    Review of Systems  A complete 10 system review of systems was obtained and all systems are negative except as noted in the HPI and PMH.    Allergies  Aspirin and Ibuprofen  Home Medications   Prior to Admission medications   Medication Sig Start Date End Date Taking? Authorizing Provider  ALPRAZolam Prudy Feeler(XANAX) 1 MG tablet Take 1 tablet (1 mg total) by mouth at bedtime as needed for anxiety. 09/30/14   Doris Cheadleeepak Advani, MD  butalbital-acetaminophen-caffeine (ESGIC) 50-325-40 MG per tablet Take 1 tablet by mouth every 6 (six) hours as needed for headache. 05/05/14   Doris Cheadleeepak Advani, MD  doxycycline  (VIBRA-TABS) 100 MG tablet Take 1 tablet (100 mg total) by mouth 2 (two) times daily. 05/05/14   Doris Cheadleeepak Advani, MD  fluticasone (FLONASE) 50 MCG/ACT nasal spray INHALE 2 SPRAYS INTO BOTH NOSTRILS DAILY. 10/21/14   Doris Cheadleeepak Advani, MD  HYDROcodone-acetaminophen (NORCO) 5-325 MG per tablet Take 1 tablet by mouth every 6 (six) hours as needed for pain. 06/04/13   Lenda KelpShane R Hudnall, MD  HYDROcodone-acetaminophen (NORCO/VICODIN) 5-325 MG per tablet Take 2 tablets by mouth every 6 (six) hours as needed. 11/07/13   Doris Cheadleeepak Advani, MD  meloxicam (MOBIC) 7.5 MG tablet Take 1 tablet (7.5 mg total) by mouth 2 (two) times daily. 10/07/13   Richarda OverlieNayana Abrol, MD  methocarbamol (ROBAXIN) 500 MG tablet Take 1 tablet (500 mg total) by mouth every 8 (eight) hours as needed for muscle spasms. 10/07/13   Richarda OverlieNayana Abrol, MD  traMADol (ULTRAM) 50 MG tablet Take 1 tablet (50 mg total) by mouth every 8 (eight) hours as needed. 10/07/13   Richarda OverlieNayana Abrol, MD   BP 137/83 mmHg  Pulse 69  Temp(Src) 98.9 F (37.2 C) (Oral)  Resp 20  Wt 242 lb (109.77 kg)  SpO2 100% Physical Exam  Constitutional: He is oriented to person, place, and time. He appears well-developed and well-nourished. No distress.  HENT:  Head: Normocephalic and atraumatic.  Eyes: Conjunctivae and EOM are normal.  Neck: Neck supple. No tracheal deviation present.  Cardiovascular: Normal rate.   Pulmonary/Chest: Effort  normal. No respiratory distress.  Musculoskeletal: Normal range of motion. He exhibits edema and tenderness.  Right knee swelling. Tenderness along medial and lateral joint line. Normal sensation and strength. Pain with ROM.  Neurological: He is alert and oriented to person, place, and time.  Skin: Skin is warm and dry.  Psychiatric: He has a normal mood and affect. His behavior is normal.  Nursing note and vitals reviewed.   ED Course  Procedures (including critical care time)  DIAGNOSTIC STUDIES: Oxygen Saturation is 100% on room air, normal by my  interpretation.    COORDINATION OF CARE: 9:30 PM - Patient made aware of XR results. Discussed treatment plan with pt at bedside which includes a knee brace, crutches, RICE protocol, Rx pain medication, and ortho follow-up in 1 week. Pt verbalized understanding and agreed to plan.  Imaging Review Dg Knee Complete 4 Views Right  01/31/2015   CLINICAL DATA:  39 year old male with right knee pain and swelling. Injury sustained while playing basketball earlier today  EXAM: RIGHT KNEE - COMPLETE 4+ VIEW  COMPARISON:  None.  FINDINGS: No evidence of acute fracture or malalignment. Positive for suprapatellar joint effusion. No acute fracture or malalignment. Normal bony mineralization. No lytic or blastic osseous lesion.  IMPRESSION: Positive for moderate knee joint effusion without evidence of acute fracture or malalignment. If there is clinical concern for internal derangement, recommend MRI of the knee in 7-10 days following resolution of acute edema.   Electronically Signed   By: Malachy Moan M.D.   On: 01/31/2015 20:53    MDM   Final diagnoses:  Knee effusion, right  Knee sprain, right, initial encounter   patient with an injury while playing basketball night with evidence of moderate knee effusion. He has significant nervousness when attempting to ambulate but pain is controlled with rest. X-ray without acute bony injury at this time however patient placed in knee immobilizer and given crutches and follow-up for most likely MRI in the future to rule out ligamentous injury I personally performed the services described in this documentation, which was scribed in my presence.  The recorded information has been reviewed and considered.    Anthony Sprout, MD 02/01/15 0003

## 2015-02-08 ENCOUNTER — Ambulatory Visit (INDEPENDENT_AMBULATORY_CARE_PROVIDER_SITE_OTHER): Payer: BLUE CROSS/BLUE SHIELD

## 2015-02-08 ENCOUNTER — Encounter: Payer: Self-pay | Admitting: Family Medicine

## 2015-02-08 ENCOUNTER — Ambulatory Visit (INDEPENDENT_AMBULATORY_CARE_PROVIDER_SITE_OTHER): Payer: BLUE CROSS/BLUE SHIELD | Admitting: Family Medicine

## 2015-02-08 VITALS — BP 139/88 | HR 72 | Ht 71.0 in | Wt 242.0 lb

## 2015-02-08 DIAGNOSIS — Y9367 Activity, basketball: Secondary | ICD-10-CM | POA: Diagnosis not present

## 2015-02-08 DIAGNOSIS — S82091A Other fracture of right patella, initial encounter for closed fracture: Secondary | ICD-10-CM | POA: Diagnosis not present

## 2015-02-08 DIAGNOSIS — W1839XA Other fall on same level, initial encounter: Secondary | ICD-10-CM

## 2015-02-08 DIAGNOSIS — S83511A Sprain of anterior cruciate ligament of right knee, initial encounter: Secondary | ICD-10-CM

## 2015-02-08 DIAGNOSIS — S8991XA Unspecified injury of right lower leg, initial encounter: Secondary | ICD-10-CM

## 2015-02-08 MED ORDER — HYDROCODONE-ACETAMINOPHEN 5-325 MG PO TABS: 1.0000 | ORAL_TABLET | Freq: Four times a day (QID) | ORAL | Status: DC | PRN

## 2015-02-08 NOTE — Patient Instructions (Signed)
I'm concerned you tore your ACL and medial meniscus. We will go ahead with an MRI to further assess. Norco as needed for severe pain. Straight leg raises, knee extension 3 sets of 10 once a day - add ankle weight if these become too easy. Icing 15 minutes at a time 3-4 times a day at least. Half days at work though it's ok for you to work full days if you can tolerate this - see work note. Use immobilizer only if absolutely necessary as this leads to quadriceps atrophy and hasn't been proven with your injury to prevent it from giving out. ACE wrap for swelling. Elevate above the level of your heart as much as possible.

## 2015-02-09 DIAGNOSIS — S8991XA Unspecified injury of right lower leg, initial encounter: Secondary | ICD-10-CM | POA: Insufficient documentation

## 2015-02-09 NOTE — Assessment & Plan Note (Signed)
concerning for ACL and medial meniscus tears.  Will go ahead with MRI to confirm and refer to ortho if he has these injuries.  Shown home exercises to do for prehabilitation.  Norco, icing.  Immobilizer only if necessary.  ACE wrap for swelling.

## 2015-02-09 NOTE — Progress Notes (Addendum)
PCP: Doris Cheadle, MD  Subjective:   HPI: Patient is a 38 y.o. male here for right knee injury.  Patient reports he was playing basketball on 4/24 when he planted right foot, pushed off and felt a pop within right knee. Couldn't bear weight. + swelling. Went to ED - radiographs negative. Has been icing, taking hydrocodone. Pain up to 8/10 level at nighttime. Using immobilizer and crutches.  No past medical history on file.  Current Outpatient Prescriptions on File Prior to Visit  Medication Sig Dispense Refill  . ALPRAZolam (XANAX) 1 MG tablet Take 1 tablet (1 mg total) by mouth at bedtime as needed for anxiety. 30 tablet 0  . butalbital-acetaminophen-caffeine (ESGIC) 50-325-40 MG per tablet Take 1 tablet by mouth every 6 (six) hours as needed for headache. 30 tablet 0  . doxycycline (VIBRA-TABS) 100 MG tablet Take 1 tablet (100 mg total) by mouth 2 (two) times daily. 20 tablet 0  . fluticasone (FLONASE) 50 MCG/ACT nasal spray INHALE 2 SPRAYS INTO BOTH NOSTRILS DAILY. 16 g 1  . meloxicam (MOBIC) 7.5 MG tablet Take 1 tablet (7.5 mg total) by mouth 2 (two) times daily. 60 tablet 0  . methocarbamol (ROBAXIN) 500 MG tablet Take 1 tablet (500 mg total) by mouth every 8 (eight) hours as needed for muscle spasms. 60 tablet 2  . traMADol (ULTRAM) 50 MG tablet Take 1 tablet (50 mg total) by mouth every 8 (eight) hours as needed. 90 tablet 0   No current facility-administered medications on file prior to visit.    No past surgical history on file.  Allergies  Allergen Reactions  . Aspirin   . Ibuprofen     Rash    History   Social History  . Marital Status: Single    Spouse Name: N/A  . Number of Children: N/A  . Years of Education: N/A   Occupational History  . Not on file.   Social History Main Topics  . Smoking status: Never Smoker   . Smokeless tobacco: Not on file  . Alcohol Use: No  . Drug Use: No  . Sexual Activity: No   Other Topics Concern  . Not on file    Social History Narrative    Family History  Problem Relation Age of Onset  . Sudden death Neg Hx   . Hypertension Neg Hx   . Hyperlipidemia Neg Hx   . Heart attack Neg Hx   . Diabetes Neg Hx     BP 139/88 mmHg  Pulse 72  Ht  (1.803 m)  Wt 242 lb (109.77 kg)  BMI 33.77 kg/m2  Review of Systems: See HPI above.    Objective:  Physical Exam:  Gen: NAD  Right knee: Mod effusion.  No bruising, other deformity. TTP medial joint line - no other tenderness. FROM. Positive ant drawer, lachmanns.  Negative post drawer,  valgus/varus testing.  Positive medial mcmurrays, apleys Negative patellar apprehension. NV intact distally.    Assessment & Plan:  1. Right knee injury - concerning for ACL and medial meniscus tears.  Will go ahead with MRI to confirm and refer to ortho if he has these injuries.  Shown home exercises to do for prehabilitation.  Norco, icing.  Immobilizer only if necessary.  ACE wrap for swelling.    Addendum:  MRI reviewed and discussed with patient.  He has a partial ACL tear but majority of fibers are intact - should do well with conservative care.  Also has a nondisplaced tibial  plateau fracture.  Advised patient use crutches with at most touch-down weight bearing and f/u in 2-4 weeks.  Consider repeat radiographs at that time to ensure no collapse (extremely unlikely) of his nondisplaced fracture.

## 2015-02-10 NOTE — Addendum Note (Signed)
Addended by: Lenda KelpHUDNALL, Taavi Hoose R on: 02/10/2015 08:24 AM   Modules accepted: Kipp BroodSmartSet

## 2015-02-25 ENCOUNTER — Ambulatory Visit (HOSPITAL_BASED_OUTPATIENT_CLINIC_OR_DEPARTMENT_OTHER)
Admission: RE | Admit: 2015-02-25 | Discharge: 2015-02-25 | Disposition: A | Discharge status: Home / Self Care | Payer: BLUE CROSS/BLUE SHIELD | Source: Ambulatory Visit | Attending: Family Medicine | Admitting: Family Medicine

## 2015-02-25 ENCOUNTER — Encounter: Payer: Self-pay | Admitting: Family Medicine

## 2015-02-25 ENCOUNTER — Ambulatory Visit (INDEPENDENT_AMBULATORY_CARE_PROVIDER_SITE_OTHER): Payer: BLUE CROSS/BLUE SHIELD | Admitting: Family Medicine

## 2015-02-25 VITALS — BP 160/90 | HR 73 | Ht 71.0 in | Wt 245.0 lb

## 2015-02-25 DIAGNOSIS — X58XXXD Exposure to other specified factors, subsequent encounter: Secondary | ICD-10-CM | POA: Insufficient documentation

## 2015-02-25 DIAGNOSIS — S82144D Nondisplaced bicondylar fracture of right tibia, subsequent encounter for closed fracture with routine healing: Secondary | ICD-10-CM | POA: Insufficient documentation

## 2015-02-25 DIAGNOSIS — S8991XD Unspecified injury of right lower leg, subsequent encounter: Secondary | ICD-10-CM

## 2015-02-25 DIAGNOSIS — M25561 Pain in right knee: Secondary | ICD-10-CM

## 2015-03-02 NOTE — Assessment & Plan Note (Signed)
nondisplaced tibial plateau fracture and partial ACL tear.  Will start weight bearing as tolerated more over the next 2 weeks and follow-up at that time.  Radiographs show no displacement.  Norco, icing.  ACE wrap for swelling.  Plan to start PT at follow-up.

## 2015-03-02 NOTE — Progress Notes (Signed)
PCP: Doris CheadleADVANI, DEEPAK, MD  Subjective:   HPI: Patient is a 39 y.o. male here for right knee injury.  5/2: Patient reports he was playing basketball on 4/24 when he planted right foot, pushed off and felt a pop within right knee. Couldn't bear weight. + swelling. Went to ED - radiographs negative. Has been icing, taking hydrocodone. Pain up to 8/10 level at nighttime. Using immobilizer and crutches.  5/19: Patient reports he is about 80% improved. Pain gets severe if he tries to twist the knee. Using crutches, doing touch down weight bearing. Doing home exercises. Swelling improved.  No past medical history on file.  Current Outpatient Prescriptions on File Prior to Visit  Medication Sig Dispense Refill  . ALPRAZolam (XANAX) 1 MG tablet Take 1 tablet (1 mg total) by mouth at bedtime as needed for anxiety. 30 tablet 0  . butalbital-acetaminophen-caffeine (ESGIC) 50-325-40 MG per tablet Take 1 tablet by mouth every 6 (six) hours as needed for headache. 30 tablet 0  . doxycycline (VIBRA-TABS) 100 MG tablet Take 1 tablet (100 mg total) by mouth 2 (two) times daily. 20 tablet 0  . fluticasone (FLONASE) 50 MCG/ACT nasal spray INHALE 2 SPRAYS INTO BOTH NOSTRILS DAILY. 16 g 1  . HYDROcodone-acetaminophen (NORCO/VICODIN) 5-325 MG per tablet Take 1 tablet by mouth every 6 (six) hours as needed. 50 tablet 0  . meloxicam (MOBIC) 7.5 MG tablet Take 1 tablet (7.5 mg total) by mouth 2 (two) times daily. 60 tablet 0  . methocarbamol (ROBAXIN) 500 MG tablet Take 1 tablet (500 mg total) by mouth every 8 (eight) hours as needed for muscle spasms. 60 tablet 2  . traMADol (ULTRAM) 50 MG tablet Take 1 tablet (50 mg total) by mouth every 8 (eight) hours as needed. 90 tablet 0   No current facility-administered medications on file prior to visit.    No past surgical history on file.  Allergies  Allergen Reactions  . Aspirin   . Ibuprofen     Rash    History   Social History  . Marital Status:  Single    Spouse Name: N/A  . Number of Children: N/A  . Years of Education: N/A   Occupational History  . Not on file.   Social History Main Topics  . Smoking status: Never Smoker   . Smokeless tobacco: Not on file  . Alcohol Use: No  . Drug Use: No  . Sexual Activity: No   Other Topics Concern  . Not on file   Social History Narrative    Family History  Problem Relation Age of Onset  . Sudden death Neg Hx   . Hypertension Neg Hx   . Hyperlipidemia Neg Hx   . Heart attack Neg Hx   . Diabetes Neg Hx     BP 160/90 mmHg  Pulse 73  Ht 5\' 11"  (1.803 m)  Wt 245 lb (111.131 kg)  BMI 34.19 kg/m2  Review of Systems: See HPI above.    Objective:  Physical Exam:  Gen: NAD  Right knee: No effusion.  No bruising, other deformity. Minimal TTP medial joint line - no other tenderness. FROM. 1+ ant drawer, lachmanns.  Negative post drawer,  valgus/varus testing.  Positive medial mcmurrays, apleys Negative patellar apprehension. NV intact distally.    Assessment & Plan:  1. Right knee injury - nondisplaced tibial plateau fracture and partial ACL tear.  Will start weight bearing as tolerated more over the next 2 weeks and follow-up at that time.  Radiographs show no displacement.  Norco, icing.  ACE wrap for swelling.  Plan to start PT at follow-up.

## 2015-03-04 ENCOUNTER — Other Ambulatory Visit: Payer: Self-pay | Admitting: Internal Medicine

## 2015-03-15 ENCOUNTER — Telehealth: Payer: Self-pay

## 2015-03-15 ENCOUNTER — Ambulatory Visit (INDEPENDENT_AMBULATORY_CARE_PROVIDER_SITE_OTHER): Payer: BLUE CROSS/BLUE SHIELD | Admitting: Family Medicine

## 2015-03-15 ENCOUNTER — Telehealth: Payer: Self-pay | Admitting: Internal Medicine

## 2015-03-15 ENCOUNTER — Encounter: Payer: Self-pay | Admitting: Family Medicine

## 2015-03-15 VITALS — BP 147/88 | HR 75 | Ht 71.0 in | Wt 242.0 lb

## 2015-03-15 DIAGNOSIS — M25561 Pain in right knee: Secondary | ICD-10-CM

## 2015-03-15 DIAGNOSIS — S8991XD Unspecified injury of right lower leg, subsequent encounter: Secondary | ICD-10-CM

## 2015-03-15 MED ORDER — FLUTICASONE PROPIONATE 50 MCG/ACT NA SUSP: NASAL | Status: DC

## 2015-03-15 NOTE — Telephone Encounter (Signed)
Patient called requesting a refill on his flonase Prescription sent [facility] [place]

## 2015-03-15 NOTE — Patient Instructions (Signed)
Ok to discontinue the crutches now. Start physical therapy. Do home exercises on days you don't go to therapy. Knee brace for support when up and walking around. Follow up with me in about 6 weeks. If you're still having problems with stability and not improving would consider surgery referral though you should not need this.

## 2015-03-15 NOTE — Telephone Encounter (Signed)
[facility]

## 2015-03-16 NOTE — Assessment & Plan Note (Signed)
nondisplaced tibial plateau fracture and partial ACL tear.  Improved from plateau fracture clinically.  Will start physical therapy and f/u in 6 weeks.  Knee brace for support.

## 2015-03-16 NOTE — Progress Notes (Signed)
PCP: Doris CheadleADVANI, DEEPAK, MD  Subjective:   HPI: Patient is a 39 y.o. male here for right knee injury.  5/2: Patient reports he was playing basketball on 4/24 when he planted right foot, pushed off and felt a pop within right knee. Couldn't bear weight. + swelling. Went to ED - radiographs negative. Has been icing, taking hydrocodone. Pain up to 8/10 level at nighttime. Using immobilizer and crutches.  5/19: Patient reports he is about 80% improved. Pain gets severe if he tries to twist the knee. Using crutches, doing touch down weight bearing. Doing home exercises. Swelling improved.  6/6: Patient reports pain has improved, down to 1/10 soreness. Still feels unstable. Doing home exercises.  No past medical history on file.  Current Outpatient Prescriptions on File Prior to Visit  Medication Sig Dispense Refill  . ALPRAZolam (XANAX) 1 MG tablet Take 1 tablet (1 mg total) by mouth at bedtime as needed for anxiety. 30 tablet 0  . butalbital-acetaminophen-caffeine (ESGIC) 50-325-40 MG per tablet Take 1 tablet by mouth every 6 (six) hours as needed for headache. 30 tablet 0  . doxycycline (VIBRA-TABS) 100 MG tablet Take 1 tablet (100 mg total) by mouth 2 (two) times daily. 20 tablet 0  . HYDROcodone-acetaminophen (NORCO/VICODIN) 5-325 MG per tablet Take 1 tablet by mouth every 6 (six) hours as needed. 50 tablet 0  . meloxicam (MOBIC) 7.5 MG tablet Take 1 tablet (7.5 mg total) by mouth 2 (two) times daily. 60 tablet 0  . methocarbamol (ROBAXIN) 500 MG tablet Take 1 tablet (500 mg total) by mouth every 8 (eight) hours as needed for muscle spasms. 60 tablet 2  . traMADol (ULTRAM) 50 MG tablet Take 1 tablet (50 mg total) by mouth every 8 (eight) hours as needed. 90 tablet 0   No current facility-administered medications on file prior to visit.    No past surgical history on file.  Allergies  Allergen Reactions  . Aspirin   . Ibuprofen     Rash    History   Social History  .  Marital Status: Single    Spouse Name: N/A  . Number of Children: N/A  . Years of Education: N/A   Occupational History  . Not on file.   Social History Main Topics  . Smoking status: Never Smoker   . Smokeless tobacco: Not on file  . Alcohol Use: No  . Drug Use: No  . Sexual Activity: No   Other Topics Concern  . Not on file   Social History Narrative    Family History  Problem Relation Age of Onset  . Sudden death Neg Hx   . Hypertension Neg Hx   . Hyperlipidemia Neg Hx   . Heart attack Neg Hx   . Diabetes Neg Hx     BP 147/88 mmHg  Pulse 75  Ht 5\' 11"  (1.803 m)  Wt 242 lb (109.77 kg)  BMI 33.77 kg/m2  Review of Systems: See HPI above.    Objective:  Physical Exam:  Gen: NAD  Right knee: No effusion.  No bruising, other deformity. No TTP medial joint line. FROM. Trace ant drawer, lachmanns.  Negative post drawer,  valgus/varus testing.  Negative medial mcmurrays, apleys Negative patellar apprehension. NV intact distally.    Assessment & Plan:  1. Right knee injury - nondisplaced tibial plateau fracture and partial ACL tear.  Improved from plateau fracture clinically.  Will start physical therapy and f/u in 6 weeks.  Knee brace for support.

## 2015-03-30 ENCOUNTER — Encounter: Payer: Self-pay | Admitting: Physical Therapy

## 2015-03-30 ENCOUNTER — Ambulatory Visit: Payer: BLUE CROSS/BLUE SHIELD | Attending: Family Medicine | Admitting: Physical Therapy

## 2015-03-30 DIAGNOSIS — M25561 Pain in right knee: Secondary | ICD-10-CM | POA: Insufficient documentation

## 2015-03-30 DIAGNOSIS — M6281 Muscle weakness (generalized): Secondary | ICD-10-CM | POA: Diagnosis not present

## 2015-03-30 DIAGNOSIS — R262 Difficulty in walking, not elsewhere classified: Secondary | ICD-10-CM | POA: Insufficient documentation

## 2015-03-31 NOTE — Therapy (Signed)
[facility] [address]  [address] [place], [place], [address] Phone: [phone]   Fax:  [phone]  Physical Therapy Evaluation  Patient Details  Name: Anthony Benjamin MRN: [id number] Date of Birth: 02-07-1976 Referring Provider:  Lenda Kelp, MD  Encounter Date: 03/30/2015      PT End of Session - 03/30/15 1705    Visit Number 1   Number of Visits 12   Date for PT Re-Evaluation 05/11/15   PT Start Time 1701   PT Stop Time 1751   PT Time Calculation (min) 50 min      History reviewed. No pertinent past medical history.  History reviewed. No pertinent past surgical history.  There were no vitals filed for this visit.  Visit Diagnosis:  Right knee pain - Plan: PT plan of care cert/re-cert  Quadriceps weakness - Plan: PT plan of care cert/re-cert  Difficulty walking - Plan: PT plan of care cert/re-cert      Subjective Assessment - 03/30/15 1705    Subjective pt with c/o R knee pain since 01/31/15.  States pain began while playing basketball.  States landed funny on R LE after performing layup. Noting immediate swelling.  Went to ED and was advised likely had ACL tear.  Underwent MRI which was NEG for ACL rupture but did note ACL strain and lateral tibial plateau nondisplaced fracture and edema.    How long can you stand comfortably? up to 5 minutes   How long can you walk comfortably? "every step causes some pain"   Diagnostic tests MRI indicates ACL strain and tibial plateau nondisplaced fracture   Currently in Pain? Yes   Pain Score --  Best 2/10, AVG pain 5/10, worst pain up to 10/10 with pivot/twisting.   Pain Location Knee   Pain Orientation Right;Anterior;Medial   Pain Descriptors / Indicators Sharp   Pain Frequency Constant   Aggravating Factors  wt bearing, twisting, pivoting, crossing leg to tie shoe   Pain Relieving Factors meds, rest (non wt bearing)            [facility] PT Assessment -  03/30/15 1751    Assessment   Medical Diagnosis R knee pain   Onset Date/Surgical Date 01/31/15   Balance Screen   Has the patient fallen in the past 6 months No   Has the patient had a decrease in activity level because of a fear of falling?  No   Is the patient reluctant to leave their home because of a fear of falling?  No   Home [employer]ist information centre manager residence   Living Arrangements Spouse/significant other   Type of Home House   Home Access Stairs to enter   Entrance Stairs-Number of Steps 1   Home Layout One level   Prior Function   Vocation Full time employment   Vocation Requirements office, computer work   Leisure enjoys basketball, golf, tennis   Observation/Other Assessments   Focus on Therapeutic Outcomes (FOTO)  66% limitation   AROM   Overall AROM Comments R knee with mild TKE lag (5 degrees) with poor VMO activation   PROM   Overall PROM Comments R Knee WNL but anteriolateral knee pain with over-pressure into Flexion   Strength   Right Hip External Rotation  --  4/5 - medial knee pain   Right Hip Internal Rotation  --  4/5 - medial knee pain   Right Knee Flexion 4+/5   Right Knee  Extension 4-/5  anteriolateral knee pain       TODAY'S TREATMENT TherEx - QS and SAQ 10x each with HEP instruction for same             PT Education - 03/30/15 1754    Education provided Yes   Education Details initial HEP = QS and SAQ   Person(s) Educated Patient   Methods Explanation;Demonstration   Comprehension Verbalized understanding;Returned demonstration          PT Short Term Goals - 03/30/15 1800    PT SHORT TERM GOAL #1   Title pt independent with initial HEP by 04/08/15   Status New           PT Long Term Goals - 03/30/15 1800    PT LONG TERM GOAL #1   Title pt able to ambulate with normal mechanics over level and uneven terrain without need for AD and distances not limited by pain by 05/11/15   Status New   PT LONG TERM GOAL  #2   Title pt able to tolerate light hopping to allow participation in noncompetitive basketball by 05/11/15   Status New   PT LONG TERM GOAL #3   Title pt able to tolerate pivoting and lateral movements on R LE to allow participation in golf and tennis by 05/11/15   Status New               Plan - 03/31/15 1131    Clinical Impression Statement Pt with R knee pain since injury on 01/31/15.  Injury occured as pt came down and "landed funny" after performing layup during basketball.  MRI performed 02/08/15 noted ACL strain and nondisplaced fracture to posterior aspect of lateral tibial plateau.  He display 5 degree lag with TKE AROM and c/o constant pain rating 2/10 to 10/10.  Pt's cc of pain is anterior and medial portion of knee and is noted especially with twisting forces so seems is ACL in nature rather than tibial plateau injury.  Pt is wearing hinged knee brace to help support ACL. He c/o intermittent buckling sensation to knee so instructed in TKE exercises and advised to ice knee to limit quad inhibition caused by swelling.   Pt will benefit from skilled therapeutic intervention in order to improve on the following deficits Pain;Increased edema;Decreased strength;Difficulty walking   Rehab Potential Good   PT Frequency 2x / week   PT Duration 6 weeks   PT Treatment/Interventions Therapeutic exercise;Manual techniques;Therapeutic activities;Gait training;Stair training;Neuromuscular re-education;Balance training;Cryotherapy;Vasopneumatic Device;Electrical Stimulation;Patient/family education;Functional mobility training;Ultrasound   PT Next Visit Plan Knee and hip strengthening exercises and TKE training as able (mostly CKC as able); manual and modalities for pain PRN   Consulted and Agree with Plan of Care Patient         Problem List Patient Active Problem List   Diagnosis Date Noted  . Right knee injury 02/09/2015  . IFG (impaired fasting glucose) 02/09/2014  . RUQ abdominal pain  09/12/2013  . Neuropathy 09/12/2013  . Low back pain 06/11/2013  . Bilateral hand pain 06/11/2013    Cristle Jared PT, OCS 03/31/2015, 11:58 AM  [facility] [address]  [address] [place], [place], [address] Phone: [phone]   Fax:  [phone]

## 2015-04-06 ENCOUNTER — Ambulatory Visit: Payer: BLUE CROSS/BLUE SHIELD | Admitting: Rehabilitation

## 2015-04-06 DIAGNOSIS — M6281 Muscle weakness (generalized): Secondary | ICD-10-CM

## 2015-04-06 DIAGNOSIS — M25561 Pain in right knee: Secondary | ICD-10-CM | POA: Diagnosis not present

## 2015-04-06 DIAGNOSIS — R262 Difficulty in walking, not elsewhere classified: Secondary | ICD-10-CM

## 2015-04-06 NOTE — Therapy (Signed)
[facility] [place] [address]  [address] [place], [place], [address] Phone: [phone]   Fax:  [phone]  Physical Therapy Treatment  Patient Details  Name: Anthony DibbleForrest E Benjamin MRN: [id number] Date of Birth: 07/17/1976 Referring Provider:  Lenda KelpHudnall, Shane R, MD  Encounter Date: 04/06/2015      PT End of Session - 04/06/15 1620    Visit Number 2   Number of Visits 12   Date for PT Re-Evaluation 05/11/15   PT Start Time 1617   PT Stop Time 1711   PT Time Calculation (min) 54 min      No past medical history on file.  No past surgical history on file.  There were no vitals filed for this visit.  Visit Diagnosis:  Right knee pain  Quadriceps weakness  Difficulty walking      Subjective Assessment - 04/06/15 1619    Subjective Reports he might have reaggrivated the knee while performing HEP and other home activities.    Currently in Pain? Yes   Pain Score 5    Pain Location Knee   Pain Orientation Right;Medial   Pain Descriptors / Indicators Constant     TODAY'S TREATMENT: TherEx- Rec Bike level 1x6' QS into Black Bolster 5"x10 SAQ 0# x10, 2# x8 (noted pain surrounding the patella) SLR+QS 0# x10 Bridges 3"x10 Standing TKE with Black TB 5"x10 with 2 pole assist Standing Hip Abduction 10x each leg with 2 pole assist Seated Hip Adduction with ball 5"x15 Standing Hip Extension 10x each leg with 2 pole assist Peanut Neila GearBall Tuck For Knee ROM 3"x15   Vasopneumatic Compression: Medium, 44 degrees, Rt Knee, x15'      PT Short Term Goals - 04/06/15 1623    PT SHORT TERM GOAL #1   Title pt independent with initial HEP by 04/08/15   Status Achieved           PT Long Term Goals - 04/06/15 1623    PT LONG TERM GOAL #1   Title pt able to ambulate with normal mechanics over level and uneven terrain without need for AD and distances not limited by pain by 05/11/15   Status On-going   PT LONG TERM GOAL #2   Title pt  able to tolerate light hopping to allow participation in noncompetitive basketball by 05/11/15   Status On-going   PT LONG TERM GOAL #3   Title pt able to tolerate pivoting and lateral movements on R LE to allow participation in golf and tennis by 05/11/15   Status On-going               Plan - 04/06/15 1651    Clinical Impression Statement Good tolerance to all exercises, stopped SAQ with 2# weight due to pain but all other exercises were fine. Pt very nervous with weightbearing on the Rt side but able to complete well. Will see how pt respondes to treatment before increasing HEP.    PT Next Visit Plan Knee and hip strengthening exercises and TKE training as able (mostly CKC as able); manual and modalities for pain PRN   Consulted and Agree with Plan of Care Patient        Problem List Patient Active Problem List   Diagnosis Date Noted  . Right knee injury 02/09/2015  . IFG (impaired fasting glucose) 02/09/2014  . RUQ abdominal pain 09/12/2013  . Neuropathy 09/12/2013  . Low back pain 06/11/2013  . Bilateral hand pain 06/11/2013  Nestor Ramp [place], [place] 04/06/2015, 4:57 PM  [facility] [address]  [address] [place], [place], [address] Phone: [phone]   Fax:  [phone]

## 2015-04-13 ENCOUNTER — Ambulatory Visit: Payer: BLUE CROSS/BLUE SHIELD | Attending: Family Medicine | Admitting: Rehabilitation

## 2015-04-13 ENCOUNTER — Encounter: Payer: Self-pay | Admitting: Rehabilitation

## 2015-04-13 DIAGNOSIS — M6281 Muscle weakness (generalized): Secondary | ICD-10-CM

## 2015-04-13 DIAGNOSIS — R262 Difficulty in walking, not elsewhere classified: Secondary | ICD-10-CM

## 2015-04-13 DIAGNOSIS — M25561 Pain in right knee: Secondary | ICD-10-CM

## 2015-04-13 NOTE — Therapy (Signed)
[facility] [place] [address]  [address] [place], [place], [address] Phone: [phone]   Fax:  [phone]  Physical Therapy Treatment  Patient Details  Name: Anthony DibbleForrest E Cohenour MRN: [id number] Date of Birth: 09/18/1976 Referring Provider:  Lenda KelpHudnall, Shane R, MD  Encounter Date: 04/13/2015      PT End of Session - 04/13/15 1658    Visit Number 3   Number of Visits 12   Date for PT Re-Evaluation 05/11/15   PT Start Time 1656   PT Stop Time 1731   PT Time Calculation (min) 35 min   Activity Tolerance Patient tolerated treatment well      History reviewed. No pertinent past medical history.  History reviewed. No pertinent past surgical history.  There were no vitals filed for this visit.  Visit Diagnosis:  Right knee pain  Quadriceps weakness  Difficulty walking      Subjective Assessment - 04/13/15 1655    Subjective the front of the knee is hurting today.  Did too many steps this weekend.     Currently in Pain? Yes   Pain Score 5    Pain Location Knee   Pain Orientation Right   Pain Descriptors / Indicators Constant   Aggravating Factors  stairs, wt bearing, twisting   Pain Relieving Factors meds, weight                       TODAY'S TREATMENT: TherEx- Rec Bike level 2x6'  AROM: 5-110 with pain  3 way hip green band with 2 poles x 20 each Green band side steps 2x10 bil with some increased pain with knee flexed position Standing TKE into black band x 20 SAQ 2# 10"x10 SLR+QS 2 # 2x10 Bridges on ball 3"x10x2 without hands Ball roll in/out x 10 between bridges    Pt having to leave after 30minutes             PT Short Term Goals - 04/06/15 1623    PT SHORT TERM GOAL #1   Title pt independent with initial HEP by 04/08/15   Status Achieved           PT Long Term Goals - 04/06/15 1623    PT LONG TERM GOAL #1   Title pt able to ambulate with normal mechanics over level and  uneven terrain without need for AD and distances not limited by pain by 05/11/15   Status On-going   PT LONG TERM GOAL #2   Title pt able to tolerate light hopping to allow participation in noncompetitive basketball by 05/11/15   Status On-going   PT LONG TERM GOAL #3   Title pt able to tolerate pivoting and lateral movements on R LE to allow participation in golf and tennis by 05/11/15   Status On-going               Plan - 04/13/15 1731    Clinical Impression Statement tolerated all well. had to leave early today   PT Next Visit Plan Knee and hip strengthening exercises and TKE training as able (mostly CKC as able); manual and modalities for pain PRN        Problem List Patient Active Problem List   Diagnosis Date Noted  . Right knee injury 02/09/2015  . IFG (impaired fasting glucose) 02/09/2014  . RUQ abdominal pain 09/12/2013  . Neuropathy 09/12/2013  . Low back pain 06/11/2013  . Bilateral hand pain 06/11/2013  Idamae Lusher, DPT, CMP 04/13/2015, 5:33 PM  [facility] [address]  [address] [place], [place], [address] Phone: [phone]   Fax:  [phone]

## 2015-04-15 ENCOUNTER — Ambulatory Visit: Payer: BLUE CROSS/BLUE SHIELD | Admitting: Rehabilitation

## 2015-04-15 DIAGNOSIS — R262 Difficulty in walking, not elsewhere classified: Secondary | ICD-10-CM

## 2015-04-15 DIAGNOSIS — M25561 Pain in right knee: Secondary | ICD-10-CM

## 2015-04-15 DIAGNOSIS — M6281 Muscle weakness (generalized): Secondary | ICD-10-CM

## 2015-04-15 NOTE — Therapy (Signed)
[facility] [address]  [address] [place], [place], [address] Phone: [phone]   Fax:  [phone]  Physical Therapy Treatment  Patient Details  Name: Anthony Benjamin MRN: [id number] Date of Birth: [birth year] Referring Provider:  Lenda Kelp, MD  Encounter Date: 04/15/2015      PT End of Session - 04/15/15 1615    Visit Number 4   Number of Visits 12   Date for PT Re-Evaluation 05/11/15   PT Start Time 1613   PT Stop Time 1706   PT Time Calculation (min) 53 min      No past medical history on file.  No past surgical history on file.  There were no vitals filed for this visit.  Visit Diagnosis:  Right knee pain  Quadriceps weakness  Difficulty walking      Subjective Assessment - 04/15/15 1614    Subjective Reports no major changes in his knee. Still feels like it will buckle at times even with the brace on.     Currently in Pain? Yes   Pain Score 5    Pain Location Knee   Pain Orientation Right   Pain Descriptors / Indicators Constant;Nagging      TODAY'S TREATMENT: TherEx- Rec Bike level 2x6' SLR 2# x10, SLR + ER 2# x10 Bridge + Ball Squeeze 3"x10 Rt Side-Lying Lt Hip Adduction 2# x10 DL Leg Press 95# 2W41  BOSU (up) Step-ups 3"x10 1 HHA on counter 6" Side-Step ups x4 with 2 pole assist (pain) FOB (55cm) Tuck 3"x15 FOB (55cm) Bridges 3"x10x2 SAQ 0# x10, 2# x8 (noted pain surrounding the patella)  Vasopneumatic Compression: Medium, 44 degrees, Rt Knee, x15'       PT Short Term Goals - 04/06/15 1623    PT SHORT TERM GOAL #1   Title pt independent with initial HEP by 04/08/15   Status Achieved           PT Long Term Goals - 04/06/15 1623    PT LONG TERM GOAL #1   Title pt able to ambulate with normal mechanics over level and uneven terrain without need for AD and distances not limited by pain by 05/11/15   Status On-going   PT LONG TERM GOAL #2   Title pt able to tolerate light  hopping to allow participation in noncompetitive basketball by 05/11/15   Status On-going   PT LONG TERM GOAL #3   Title pt able to tolerate pivoting and lateral movements on R LE to allow participation in golf and tennis by 05/11/15   Status On-going               Plan - 04/15/15 1642    Clinical Impression Statement Good tolerace with most exercises. Has occasions of sharp, anterior knee pain with standing exercises. Had to stop side step ups due to this pain but pt able to complete all others without problems.     PT Next Visit Plan Knee and hip strengthening exercises and TKE training as able (mostly CKC as able); manual and modalities for pain PRN   Consulted and Agree with Plan of Care Patient        Problem List Patient Active Problem List   Diagnosis Date Noted  . Right knee injury 02/09/2015  . IFG (impaired fasting glucose) 02/09/2014  . RUQ abdominal pain 09/12/2013  . Neuropathy 09/12/2013  . Low back pain 06/11/2013  . Bilateral hand pain 06/11/2013    Nestor Ramp  Desmin Daleo, PTA 04/15/2015, 4:52 PM  [facility] [place] [address]  [address] [place], [place], [address] Phone: [phone]   Fax:  [phone]

## 2015-04-20 ENCOUNTER — Ambulatory Visit: Payer: BLUE CROSS/BLUE SHIELD | Admitting: Rehabilitation

## 2015-04-20 DIAGNOSIS — M25561 Pain in right knee: Secondary | ICD-10-CM

## 2015-04-20 DIAGNOSIS — M6281 Muscle weakness (generalized): Secondary | ICD-10-CM

## 2015-04-20 DIAGNOSIS — R262 Difficulty in walking, not elsewhere classified: Secondary | ICD-10-CM

## 2015-04-20 NOTE — Therapy (Signed)
[facility] [place] [address]  [address] [place], [place], [address] Phone: [phone]   Fax:  [phone]  Physical Therapy Treatment  Patient Details  Name: Anthony DibbleForrest E Benjamin MRN: [id number] Date of Birth: 05/29/1976 Referring Provider:  Lenda KelpHudnall, Shane R, MD  Encounter Date: 04/20/2015      PT End of Session - 04/20/15 1658    Visit Number 5   Number of Visits 12   Date for PT Re-Evaluation 05/11/15   PT Start Time 1700   PT Stop Time 1753   PT Time Calculation (min) 53 min      No past medical history on file.  No past surgical history on file.  There were no vitals filed for this visit.  Visit Diagnosis:  Right knee pain  Quadriceps weakness  Difficulty walking      Subjective Assessment - 04/20/15 1705    Subjective Still no changes in regards to pain. States his knee did well over the weekend but will have occasions where his gets shooting pain down his leg. He also started going the gym again but only for his upper body.    Currently in Pain? Yes   Pain Score 5    Pain Location Knee   Pain Orientation Right   Pain Descriptors / Indicators Constant            [facility] PT Assessment - 04/20/15 0001    AROM   Overall AROM Comments Rt knee 3-115       TODAY'S TREATMENT: TherEx- Rec Bike level 2x6' Standing 4 way Hip Double Green TB x10 Bil, 2 pole assist Standing TKE with Black TB 5"x10  DL Leg Press 69#35# [id number] BOSU (up) Step ups 5"x15 2 pole assist BOSU (up) Lateral Step ups x10 2 pole assist (noted anterior and lateral knee pain) Wall sits with ball squeeze x3 (stopped due to anterior knee pain) Seated Hip Adduction with ball squeeze 5"x15 SLR 2# x15, SLR + ER 2# x10  Vasopneumatic Compression: Medium, 44 degrees, Rt Knee, x15'       PT Short Term Goals - 04/06/15 1623    PT SHORT TERM GOAL #1   Title pt independent with initial HEP by 04/08/15   Status Achieved           PT Long Term  Goals - 04/06/15 1623    PT LONG TERM GOAL #1   Title pt able to ambulate with normal mechanics over level and uneven terrain without need for AD and distances not limited by pain by 05/11/15   Status On-going   PT LONG TERM GOAL #2   Title pt able to tolerate light hopping to allow participation in noncompetitive basketball by 05/11/15   Status On-going   PT LONG TERM GOAL #3   Title pt able to tolerate pivoting and lateral movements on R LE to allow participation in golf and tennis by 05/11/15   Status On-going               Plan - 04/20/15 1742    Clinical Impression Statement Limited toelrance with CKC exercises, most noted with lateral step ups and TKE. Pt able to complete but could feel the pain coming on. Stopped wall sits with ball squeeze due to sharp knee pain.    PT Next Visit Plan Knee and hip strengthening exercises and TKE training as able (mostly CKC as able); manual and modalities for pain PRN   Consulted and Agree with  Plan of Care Patient        Problem List Patient Active Problem List   Diagnosis Date Noted  . Right knee injury 02/09/2015  . IFG (impaired fasting glucose) 02/09/2014  . RUQ abdominal pain 09/12/2013  . Neuropathy 09/12/2013  . Low back pain 06/11/2013  . Bilateral hand pain 06/11/2013    Nestor Ramp [place], [place] 04/20/2015, 5:44 PM  [facility] [address]  [address] [place], [place], [address] Phone: [phone]   Fax:  [phone]

## 2015-04-22 ENCOUNTER — Ambulatory Visit: Payer: BLUE CROSS/BLUE SHIELD | Admitting: Rehabilitation

## 2015-04-22 DIAGNOSIS — M25561 Pain in right knee: Secondary | ICD-10-CM

## 2015-04-22 DIAGNOSIS — R262 Difficulty in walking, not elsewhere classified: Secondary | ICD-10-CM

## 2015-04-22 DIAGNOSIS — M6281 Muscle weakness (generalized): Secondary | ICD-10-CM

## 2015-04-22 NOTE — Therapy (Signed)
[facility] [place] [address]  [address] [place], [place], [address] Phone: [phone]   Fax:  [phone]  Physical Therapy Treatment  Patient Details  Name: Anthony DibbleForrest E Benjamin MRN: [id number] Date of Birth: 03/14/1976 Referring Provider:  Lenda KelpHudnall, Shane R, MD  Encounter Date: 04/22/2015      PT End of Session - 04/22/15 1659    Visit Number 6   Number of Visits 12   Date for PT Re-Evaluation 05/11/15   PT Start Time 1652   PT Stop Time 1740   PT Time Calculation (min) 48 min      No past medical history on file.  No past surgical history on file.  There were no vitals filed for this visit.  Visit Diagnosis:  Right knee pain  Quadriceps weakness  Difficulty walking      Subjective Assessment - 04/22/15 1655    Subjective Reports no real changes with his knee since starting therapy. States on average his pain is a 5/10 and at worst a 7-8/10 with weightbearing. Pt continues to note clicking, popping, and buckling sensations with weightbearing and ROM.    How long can you stand comfortably? 5 minutes   How long can you walk comfortably? Has pain with all weightbearing   Currently in Pain? Yes   Pain Score 6    Pain Location Knee   Pain Orientation Right;Anterior   Pain Descriptors / Indicators Constant            [facility] PT Assessment - 04/22/15 1700    AROM   Overall AROM Comments Rt knee 4-118   Strength   Right Hip External Rotation  --  4/5 anterior knee pain   Right Hip Internal Rotation  --  4/5 anterior knee pain   Right Knee Flexion 4+/5   Right Knee Extension 4+/5     TODAY'S TREATMENT: TherEx- Rec Bike level 2x6'  MMT/ROM/Special Testing: Reproducible painful clicking with valgus strain. Negative McMurry's. Questionable MCL Discussion of current progress/future options/testing results  Bridge with ball squeeze 5"x15 Rt SLR 2# x15,  Lt Side-Lying Rt Hip Abduction 2# x15 Prone Rt SLR 2#  x15 Rt Side-Lying Rt Hip Adduction 2# x15       PT Short Term Goals - 04/06/15 1623    PT SHORT TERM GOAL #1   Title pt independent with initial HEP by 04/08/15   Status Achieved           PT Long Term Goals - 04/06/15 1623    PT LONG TERM GOAL #1   Title pt able to ambulate with normal mechanics over level and uneven terrain without need for AD and distances not limited by pain by 05/11/15   Status On-going   PT LONG TERM GOAL #2   Title pt able to tolerate light hopping to allow participation in noncompetitive basketball by 05/11/15   Status On-going   PT LONG TERM GOAL #3   Title pt able to tolerate pivoting and lateral movements on R LE to allow participation in golf and tennis by 05/11/15   Status On-going               Plan - 04/22/15 1710    Clinical Impression Statement Noted repeated clicking with end range extension of the knee that feels deep in the joint. Limited progress with therapy to date with keeping exercises to pt tolerance and below symptom provication. Have attempted CKC exercise but performed mostly OKC exercises today  PT Next Visit Plan Knee and hip strengthening exercises and TKE training as able (mostly CKC as able); manual and modalities for pain PRN   Consulted and Agree with Plan of Care Patient        Problem List Patient Active Problem List   Diagnosis Date Noted  . Right knee injury 02/09/2015  . IFG (impaired fasting glucose) 02/09/2014  . RUQ abdominal pain 09/12/2013  . Neuropathy 09/12/2013  . Low back pain 06/11/2013  . Bilateral hand pain 06/11/2013    Nestor Ramp [place], [place] 04/22/2015, 5:52 PM  [facility] [address]  [address] [place], [place], [address] Phone: [phone]   Fax:  [phone]

## 2015-04-26 ENCOUNTER — Ambulatory Visit (INDEPENDENT_AMBULATORY_CARE_PROVIDER_SITE_OTHER): Payer: BLUE CROSS/BLUE SHIELD | Admitting: Family Medicine

## 2015-04-26 ENCOUNTER — Encounter: Payer: Self-pay | Admitting: Family Medicine

## 2015-04-26 VITALS — BP 134/78 | HR 66 | Ht 71.0 in | Wt 240.0 lb

## 2015-04-26 DIAGNOSIS — S8991XD Unspecified injury of right lower leg, subsequent encounter: Secondary | ICD-10-CM | POA: Diagnosis not present

## 2015-04-26 DIAGNOSIS — M25561 Pain in right knee: Secondary | ICD-10-CM | POA: Diagnosis not present

## 2015-04-26 MED ORDER — METHYLPREDNISOLONE ACETATE 40 MG/ML IJ SUSP
40.0000 mg | Freq: Once | INTRAMUSCULAR | Status: AC
  Administered 2015-04-26: 40 mg via INTRA_ARTICULAR

## 2015-04-26 NOTE — Patient Instructions (Signed)
You were given a cortisone injection today. Continue with the home exercises and physical therapy. Do home exercises daily when you're done with therapy. Follow up with me in 6 weeks. If you feel well you can start leg extensions and curls in a week. In 2 weeks can consider half-squats and half-lunges. No deep squats, lunges, leg press until I see you back. If not improving can consider orthopedic referral, repeating your x-rays/CT.

## 2015-04-27 ENCOUNTER — Ambulatory Visit: Payer: BLUE CROSS/BLUE SHIELD | Admitting: Physical Therapy

## 2015-04-27 DIAGNOSIS — R262 Difficulty in walking, not elsewhere classified: Secondary | ICD-10-CM

## 2015-04-27 DIAGNOSIS — M25561 Pain in right knee: Secondary | ICD-10-CM | POA: Diagnosis not present

## 2015-04-27 DIAGNOSIS — M6281 Muscle weakness (generalized): Secondary | ICD-10-CM

## 2015-04-27 NOTE — Therapy (Signed)
[facility] [place] [address]  [address] [place], [place], [address] Phone: [phone]   Fax:  [phone]  Physical Therapy Treatment  Patient Details  Name: Anthony DibbleForrest E Majette MRN: [id number] Date of Birth: 12/10/1975 Referring Provider:  Lenda KelpHudnall, Shane R, MD  Encounter Date: 04/27/2015      PT End of Session - 04/27/15 1752    Visit Number 7   Number of Visits 12   Date for PT Re-Evaluation 05/11/15   PT Start Time 1700   PT Stop Time 1758   PT Time Calculation (min) 58 min   Activity Tolerance Patient tolerated treatment well;Patient limited by pain   Behavior During Therapy [facility] for tasks assessed/performed      No past medical history on file.  No past surgical history on file.  There were no vitals filed for this visit.  Visit Diagnosis:  Right knee pain  Quadriceps weakness  Difficulty walking      Subjective Assessment - 04/27/15 1704    Subjective Patient reports receiving a cortison injection yesterday, but knee still sore. Patient states MD wants him to continue with the home exercises and physical therapy and f/u with MD in 6 weeks (06/08/15). Per MD note, can start leg extensions and curls in a week if feels ok. In 2 weeks can consider half-squats and half-lunges. No deep squats, lunges, leg press until seen again by MD.    Currently in Pain? Yes   Pain Score 5    Pain Location Knee   Pain Orientation Right;Anterior  subpatellar   Pain Descriptors / Indicators Constant;Aching            [facility] PT Assessment - 04/27/15 1751    Assessment   Next MD Visit 06/08/15          TODAY'S TREATMENT:  TherEx-  Rec Bike level 2x6' Bridge with ball squeeze 5"x15 Rt SLR 2# x15, SLR + ER 2# x15 Lt Side-Lying Rt Hip Abduction 2# x15 Prone Rt SLR 2# x15 Rt Side-Lying Rt Hip Adduction 2# x15 Standing TKE with ball 5"x15 Side-stepping L/R with slight knee flexion and green TB looped around ankles 2 x 20  ft BOSU (up) Step ups attempted with 2 pole assist but c/o anterior knee pain with clicking, substituted blue oval foam but same c/o therefore exercise deferred  Vasopneumatic Compression: Medium, 44 degrees, Rt Knee, x15'           PT Short Term Goals - 04/06/15 1623    PT SHORT TERM GOAL #1   Title pt independent with initial HEP by 04/08/15   Status Achieved           PT Long Term Goals - 04/27/15 1756    PT LONG TERM GOAL #1   Title pt able to ambulate with normal mechanics over level and uneven terrain without need for AD and distances not limited by pain by 05/11/15   Status On-going   PT LONG TERM GOAL #2   Title pt able to tolerate light hopping to allow participation in noncompetitive basketball by 05/11/15   Status On-going   PT LONG TERM GOAL #3   Title pt able to tolerate pivoting and lateral movements on R LE to allow participation in golf and tennis by 05/11/15   Status On-going               Plan - 04/27/15 1752    Clinical Impression Statement Cortisone injection received yesterday has yet  to provide any pain relief. Exercises limited to mostly OKC secondary to continued pain and repeated clicking deep in knee with end of range extension, along with MD restrictions.   PT Next Visit Plan Knee and hip strengthening exercises and TKE training as able (progression to CKC as able); manual and modalities for pain PRN   Consulted and Agree with Plan of Care Patient        Problem List Patient Active Problem List   Diagnosis Date Noted  . Right knee injury 02/09/2015  . IFG (impaired fasting glucose) 02/09/2014  . RUQ abdominal pain 09/12/2013  . Neuropathy 09/12/2013  . Low back pain 06/11/2013  . Bilateral hand pain 06/11/2013    Marry Guan, PT, MPT 04/27/2015, 6:19 PM  [facility] [address]  [address] [place], [place], [address] Phone: [phone]   Fax:  [phone]

## 2015-04-29 ENCOUNTER — Ambulatory Visit: Payer: BLUE CROSS/BLUE SHIELD | Admitting: Rehabilitation

## 2015-04-29 DIAGNOSIS — M25561 Pain in right knee: Secondary | ICD-10-CM

## 2015-04-29 DIAGNOSIS — M6281 Muscle weakness (generalized): Secondary | ICD-10-CM

## 2015-04-29 DIAGNOSIS — R262 Difficulty in walking, not elsewhere classified: Secondary | ICD-10-CM

## 2015-04-29 NOTE — Assessment & Plan Note (Signed)
nondisplaced tibial plateau fracture and partial ACL tear.  He is almost 3 months out from the injury - fracture would be healed at this point.  He had no evidence of loose body or meniscus tear that could be source of persistent pain, clicking.  Has been doing physical therapy and home exercises without a lot of progress.  We discussed his options: repeating imaging(discussed MRI, another set of radiographs, CT unlikely to be beneficial), trial of injection for synovitis and pain relief, continued current treatment as he does have ACL sprain/partial tear - could take over 6 months to resolve, or orthopedic surgery referral.  Based on his MRI and clear evidence of many ACL fibers intact I would not go ahead with referral unless he continues to feel instability, fails other measures for another 6 weeks at least.  Went ahead with injection today.  After informed written consent, patient was seated on exam table. Right knee was prepped with alcohol swab and utilizing anteromedial approach, patient's right knee was injected intraarticularly with 3:1 marcaine: depomedrol. Patient tolerated the procedure well without immediate complications.

## 2015-04-29 NOTE — Therapy (Addendum)
[facility] [address]  [place], [place], [address] Phone: [phone]   Fax:  [phone]  Physical Therapy Treatment  Patient Details  Name: Anthony Benjamin MRN: [id number] Date of Birth: [birth year] Referring Provider:  Dene Gentry, MD  Encounter Date: 04/29/2015      PT End of Session - 04/29/15 1702    Visit Number 8   Number of Visits 12   Date for PT Re-Evaluation 05/11/15   PT Start Time 1701   PT Stop Time 1741   PT Time Calculation (min) 40 min   Activity Tolerance Patient tolerated treatment well   Behavior During Therapy [facility] for tasks assessed/performed      No past medical history on file.  No past surgical history on file.  There were no vitals filed for this visit.  Visit Diagnosis:  Right knee pain  Quadriceps weakness  Difficulty walking      Subjective Assessment - 04/29/15 1702    Subjective Still notes minimal improvements with cortizon injection (3 days post injection).     Currently in Pain? Yes   Pain Score 4    Pain Location Knee   Pain Orientation Right;Anterior     TODAY'S TREATMENT:  TherEx-  Rec Bike level 3x6' March on Blue Foam x15 with 2 pole assist Standing TKE with Black TB 5"x15 Standing Hip Flexion, Abduction, Extension with Double Green TB x10 each leg Side-Stepping with Green TB 24 ft x 2  Bridges x15 SL Bridges x10 Rt SLR 2# x15, SLR + ER 2# x15  Increased HEP, gym program, reviewed restrictions and precautions by MD,         PT Short Term Goals - 04/06/15 1623    PT SHORT TERM GOAL #1   Title pt independent with initial HEP by 04/08/15   Status Achieved           PT Long Term Goals - 04/27/15 1756    PT LONG TERM GOAL #1   Title pt able to ambulate with normal mechanics over level and uneven terrain without need for AD and distances not limited by pain by 05/11/15   Status On-going   PT LONG TERM GOAL #2   Title pt able to tolerate  light hopping to allow participation in noncompetitive basketball by 05/11/15   Status On-going   PT LONG TERM GOAL #3   Title pt able to tolerate pivoting and lateral movements on R LE to allow participation in golf and tennis by 05/11/15   Status On-going               Plan - 04/29/15 1744    Clinical Impression Statement Pt would like to go on hold for 1 month and perform HEP and gym program. Updated HEP today and reviewed precautions/restrictions by MD.   PT Next Visit Plan On hold x1 month per pt preference.    Consulted and Agree with Plan of Care Patient        Problem List Patient Active Problem List   Diagnosis Date Noted  . Right knee injury 02/09/2015  . IFG (impaired fasting glucose) 02/09/2014  . RUQ abdominal pain 09/12/2013  . Neuropathy 09/12/2013  . Low back pain 06/11/2013  . Bilateral hand pain 06/11/2013    Anthony Benjamin, [place] 04/29/2015, 5:48 PM  [facility] [address]  [place], [place], [address] Phone: [phone]   Fax:  [phone]     PHYSICAL THERAPY DISCHARGE SUMMARY  Visits from Start of Care: 8  Current functional level related to goals / functional outcomes: Goals not met   Remaining deficits: Knee pain   Education / Equipment: HEP Plan: Patient agrees to discharge.  Patient goals were not met. Patient is being discharged due to the patient's request.  ?????       Anthony Benjamin was last seen 04/29/15 at which time he requested stopping PT while he performed HEP and gym-based exercises to tolerance.  He noted minimal benefit with PT to date. He hasn't returned and we are therefore discharging him from our care at this time.  Leonette Most PT, OCS 06/09/2015 10:06 AM

## 2015-04-29 NOTE — Progress Notes (Signed)
PCP: Doris Cheadle, MD  Subjective:   HPI: Patient is a 39 y.o. male here for right knee injury.  5/2: Patient reports he was playing basketball on 4/24 when he planted right foot, pushed off and felt a pop within right knee. Couldn't bear weight. + swelling. Went to ED - radiographs negative. Has been icing, taking hydrocodone. Pain up to 8/10 level at nighttime. Using immobilizer and crutches.  5/19: Patient reports he is about 80% improved. Pain gets severe if he tries to twist the knee. Using crutches, doing touch down weight bearing. Doing home exercises. Swelling improved.  6/6: Patient reports pain has improved, down to 1/10 soreness. Still feels unstable. Doing home exercises.  7/18: Patient reports his knee feels only a little better. Pain level 5/10. Doing physical therapy and home exercises. Hears clicking in the knee. No catching, locking, giving out though it does feel unstable.  No past medical history on file.  Current Outpatient Prescriptions on File Prior to Visit  Medication Sig Dispense Refill  . ALPRAZolam (XANAX) 1 MG tablet Take 1 tablet (1 mg total) by mouth at bedtime as needed for anxiety. (Patient not taking: Reported on 03/30/2015) 30 tablet 0  . butalbital-acetaminophen-caffeine (ESGIC) 50-325-40 MG per tablet Take 1 tablet by mouth every 6 (six) hours as needed for headache. (Patient not taking: Reported on 03/30/2015) 30 tablet 0  . doxycycline (VIBRA-TABS) 100 MG tablet Take 1 tablet (100 mg total) by mouth 2 (two) times daily. (Patient not taking: Reported on 03/30/2015) 20 tablet 0  . fluticasone (FLONASE) 50 MCG/ACT nasal spray INHALE 2 SPRAYS INTO BOTH NOSTRILS DAILY. 16 g 1  . HYDROcodone-acetaminophen (NORCO/VICODIN) 5-325 MG per tablet Take 1 tablet by mouth every 6 (six) hours as needed. 50 tablet 0  . meloxicam (MOBIC) 7.5 MG tablet Take 1 tablet (7.5 mg total) by mouth 2 (two) times daily. (Patient not taking: Reported on 03/30/2015)  60 tablet 0  . methocarbamol (ROBAXIN) 500 MG tablet Take 1 tablet (500 mg total) by mouth every 8 (eight) hours as needed for muscle spasms. (Patient not taking: Reported on 03/30/2015) 60 tablet 2  . traMADol (ULTRAM) 50 MG tablet Take 1 tablet (50 mg total) by mouth every 8 (eight) hours as needed. (Patient not taking: Reported on 03/30/2015) 90 tablet 0   No current facility-administered medications on file prior to visit.    No past surgical history on file.  Allergies  Allergen Reactions  . Aspirin   . Ibuprofen     Rash    History   Social History  . Marital Status: Single    Spouse Name: N/A  . Number of Children: N/A  . Years of Education: N/A   Occupational History  . Not on file.   Social History Main Topics  . Smoking status: Never Smoker   . Smokeless tobacco: Not on file  . Alcohol Use: No  . Drug Use: No  . Sexual Activity: No   Other Topics Concern  . Not on file   Social History Narrative    Family History  Problem Relation Age of Onset  . Sudden death Neg Hx   . Hypertension Neg Hx   . Hyperlipidemia Neg Hx   . Heart attack Neg Hx   . Diabetes Neg Hx     BP 134/78 mmHg  Pulse 66  Ht 5\' 11"  (1.803 m)  Wt 240 lb (108.863 kg)  BMI 33.49 kg/m2  Review of Systems: See HPI above.  Objective:  Physical Exam:  Gen: NAD  Right knee: No effusion.  No bruising, other deformity. No TTP medial joint line. FROM. Trace ant drawer, lachmanns.  Negative post drawer,  valgus/varus testing.  Negative medial mcmurrays, apleys Negative patellar apprehension. NV intact distally.    Assessment & Plan:  1. Right knee injury - nondisplaced tibial plateau fracture and partial ACL tear.  He is almost 3 months out from the injury - fracture would be healed at this point.  He had no evidence of loose body or meniscus tear that could be source of persistent pain, clicking.  Has been doing physical therapy and home exercises without a lot of progress.  We  discussed his options: repeating imaging(discussed MRI, another set of radiographs, CT unlikely to be beneficial), trial of injection for synovitis and pain relief, continued current treatment as he does have ACL sprain/partial tear - could take over 6 months to resolve, or orthopedic surgery referral.  Based on his MRI and clear evidence of many ACL fibers intact I would not go ahead with referral unless he continues to feel instability, fails other measures for another 6 weeks at least.  Went ahead with injection today.  After informed written consent, patient was seated on exam table. Right knee was prepped with alcohol swab and utilizing anteromedial approach, patient's right knee was injected intraarticularly with 3:1 marcaine: depomedrol. Patient tolerated the procedure well without immediate complications.

## 2015-06-08 ENCOUNTER — Telehealth: Payer: Self-pay

## 2015-06-08 ENCOUNTER — Telehealth: Payer: Self-pay | Admitting: Internal Medicine

## 2015-06-08 ENCOUNTER — Encounter: Payer: Self-pay | Admitting: Family Medicine

## 2015-06-08 ENCOUNTER — Ambulatory Visit (INDEPENDENT_AMBULATORY_CARE_PROVIDER_SITE_OTHER): Payer: BLUE CROSS/BLUE SHIELD | Admitting: Family Medicine

## 2015-06-08 VITALS — BP 145/79 | HR 64 | Ht 71.0 in | Wt 240.0 lb

## 2015-06-08 DIAGNOSIS — S8991XD Unspecified injury of right lower leg, subsequent encounter: Secondary | ICD-10-CM

## 2015-06-08 MED ORDER — FLUTICASONE PROPIONATE 50 MCG/ACT NA SUSP: NASAL | Status: DC

## 2015-06-08 NOTE — Patient Instructions (Signed)
Use pain as your guide for activities -ok to walk, try the elliptical, swim. Focus on knee extensions, straight leg raises, hamstring curls. Over time hope is you can incorporate lunges, squats but will probably be after your next visit. Continue with the home exercises. Use pain as your guide - if it exceed 3/10 level of pain, don't do that exercise. Follow up with me in 2-3 months

## 2015-06-08 NOTE — Telephone Encounter (Signed)
Patient called requesting medication refill on fluticasone (FLONASE) 50 MCG/ACT nasal spray , patient would like it sent to pharmacy on file, please f/u

## 2015-06-08 NOTE — Telephone Encounter (Signed)
Patient called requesting a refill for his flonase A one month supply given  No refills patient needs to make an appointment to be seen

## 2015-06-08 NOTE — Telephone Encounter (Signed)
Returned patient phone call Patient not available Left message on voice mail to return our call 

## 2015-06-09 NOTE — Progress Notes (Signed)
PCP: Doris Cheadle, MD  Subjective:   HPI: Patient is a 39 y.o. male here for right knee injury.  5/2: Patient reports he was playing basketball on 4/24 when he planted right foot, pushed off and felt a pop within right knee. Couldn't bear weight. + swelling. Went to ED - radiographs negative. Has been icing, taking hydrocodone. Pain up to 8/10 level at nighttime. Using immobilizer and crutches.  5/19: Patient reports he is about 80% improved. Pain gets severe if he tries to twist the knee. Using crutches, doing touch down weight bearing. Doing home exercises. Swelling improved.  6/6: Patient reports pain has improved, down to 1/10 soreness. Still feels unstable. Doing home exercises.  7/18: Patient reports his knee feels only a little better. Pain level 5/10. Doing physical therapy and home exercises. Hears clicking in the knee. No catching, locking, giving out though it does feel unstable.  8/30: Patient reports he has improved some since last visit. Is doing home exercise program regularly. Still painful to squat, do stairs. Working out in gym now too. Pain level 1/10. No giving out recently. No catching or locking.  No past medical history on file.  Current Outpatient Prescriptions on File Prior to Visit  Medication Sig Dispense Refill  . ALPRAZolam (XANAX) 1 MG tablet Take 1 tablet (1 mg total) by mouth at bedtime as needed for anxiety. (Patient not taking: Reported on 03/30/2015) 30 tablet 0  . butalbital-acetaminophen-caffeine (ESGIC) 50-325-40 MG per tablet Take 1 tablet by mouth every 6 (six) hours as needed for headache. (Patient not taking: Reported on 03/30/2015) 30 tablet 0  . doxycycline (VIBRA-TABS) 100 MG tablet Take 1 tablet (100 mg total) by mouth 2 (two) times daily. (Patient not taking: Reported on 03/30/2015) 20 tablet 0  . HYDROcodone-acetaminophen (NORCO/VICODIN) 5-325 MG per tablet Take 1 tablet by mouth every 6 (six) hours as needed. 50 tablet  0  . meloxicam (MOBIC) 7.5 MG tablet Take 1 tablet (7.5 mg total) by mouth 2 (two) times daily. (Patient not taking: Reported on 03/30/2015) 60 tablet 0  . methocarbamol (ROBAXIN) 500 MG tablet Take 1 tablet (500 mg total) by mouth every 8 (eight) hours as needed for muscle spasms. (Patient not taking: Reported on 03/30/2015) 60 tablet 2  . traMADol (ULTRAM) 50 MG tablet Take 1 tablet (50 mg total) by mouth every 8 (eight) hours as needed. (Patient not taking: Reported on 03/30/2015) 90 tablet 0   No current facility-administered medications on file prior to visit.    No past surgical history on file.  Allergies  Allergen Reactions  . Aspirin   . Ibuprofen     Rash    Social History   Social History  . Marital Status: Single    Spouse Name: N/A  . Number of Children: N/A  . Years of Education: N/A   Occupational History  . Not on file.   Social History Main Topics  . Smoking status: Never Smoker   . Smokeless tobacco: Not on file  . Alcohol Use: No  . Drug Use: No  . Sexual Activity: No   Other Topics Concern  . Not on file   Social History Narrative    Family History  Problem Relation Age of Onset  . Sudden death Neg Hx   . Hypertension Neg Hx   . Hyperlipidemia Neg Hx   . Heart attack Neg Hx   . Diabetes Neg Hx     BP 145/79 mmHg  Pulse 64  Ht 5'  11" (1.803 m)  Wt 240 lb (108.863 kg)  BMI 33.49 kg/m2  Review of Systems: See HPI above.    Objective:  Physical Exam:  Gen: NAD  Right knee: No effusion.  No bruising, other deformity. No TTP. FROM. Trace ant drawer, lachmanns.  Negative post drawer,  valgus/varus testing.  Negative medial mcmurrays, apleys Negative patellar apprehension. NV intact distally.    Assessment & Plan:  1. Right knee injury - nondisplaced tibial plateau fracture and partial ACL tear.  Over 4 months out.  Healed from the tibial plateau fracture.  Very slow improvement from the partial ACL tear - difficulty with squatting,  steps.  Not as many problems with instability sensation.  He would like to continue with current treatment.  Not much benefit with the injection.  Would consider orthopedic surgery referral if he plateaus or worsens.  F/u in 2-3 months.

## 2015-06-10 NOTE — Assessment & Plan Note (Signed)
nondisplaced tibial plateau fracture and partial ACL tear.  Over 4 months out.  Healed from the tibial plateau fracture.  Very slow improvement from the partial ACL tear - difficulty with squatting, steps.  Not as many problems with instability sensation.  He would like to continue with current treatment.  Not much benefit with the injection.  Would consider orthopedic surgery referral if he plateaus or worsens.  F/u in 2-3 months.

## 2015-07-29 ENCOUNTER — Ambulatory Visit: Payer: BLUE CROSS/BLUE SHIELD | Attending: Family Medicine | Admitting: Family Medicine

## 2015-07-29 ENCOUNTER — Encounter: Payer: Self-pay | Admitting: Family Medicine

## 2015-07-29 VITALS — BP 136/86 | HR 58 | Temp 98.4°F | Resp 18 | Ht 71.0 in | Wt 240.4 lb

## 2015-07-29 DIAGNOSIS — R7301 Impaired fasting glucose: Secondary | ICD-10-CM

## 2015-07-29 DIAGNOSIS — Z23 Encounter for immunization: Secondary | ICD-10-CM

## 2015-07-29 DIAGNOSIS — R229 Localized swelling, mass and lump, unspecified: Secondary | ICD-10-CM | POA: Diagnosis not present

## 2015-07-29 DIAGNOSIS — R739 Hyperglycemia, unspecified: Secondary | ICD-10-CM | POA: Diagnosis not present

## 2015-07-29 DIAGNOSIS — M545 Low back pain, unspecified: Secondary | ICD-10-CM

## 2015-07-29 DIAGNOSIS — J309 Allergic rhinitis, unspecified: Secondary | ICD-10-CM

## 2015-07-29 DIAGNOSIS — S8991XD Unspecified injury of right lower leg, subsequent encounter: Secondary | ICD-10-CM

## 2015-07-29 LAB — POCT URINALYSIS DIPSTICK
BILIRUBIN UA: NEGATIVE
Blood, UA: NEGATIVE
GLUCOSE UA: NEGATIVE
KETONES UA: NEGATIVE
LEUKOCYTES UA: NEGATIVE
NITRITE UA: NEGATIVE
Protein, UA: NEGATIVE
Spec Grav, UA: 1.015
Urobilinogen, UA: 0.2
pH, UA: 6

## 2015-07-29 LAB — GLUCOSE, POCT (MANUAL RESULT ENTRY): POC Glucose: 76 mg/dl (ref 70–99)

## 2015-07-29 LAB — POCT GLYCOSYLATED HEMOGLOBIN (HGB A1C): Hemoglobin A1C: 6

## 2015-07-29 MED ORDER — FLUTICASONE PROPIONATE 50 MCG/ACT NA SUSP: NASAL | Status: DC

## 2015-07-29 NOTE — Assessment & Plan Note (Signed)
A: stable A1c P: continue diet and exercise

## 2015-07-29 NOTE — Patient Instructions (Signed)
Mr. Anthony Benjamin,  Thank you for coming in today. It was a pleasure meeting you. I look forward to being your primary doctor.   Normal exam today Continue exercise and f/u with your ortho for knee pain   Your A1c remains under 6.5 continue exercise with intermittent fasting and low carb diet to keep weight and blood sugar down  flonase refilled  F/u in one year for repeat A1c, sooner if needed  Dr. Armen PickupFunches

## 2015-07-29 NOTE — Assessment & Plan Note (Signed)
Benign RUQ Reassurance given

## 2015-07-29 NOTE — Assessment & Plan Note (Signed)
Refilled flonase  

## 2015-07-29 NOTE — Progress Notes (Signed)
C/C refill Flonase and physical No problems today

## 2015-07-29 NOTE — Assessment & Plan Note (Signed)
Followed by ortho 

## 2015-07-29 NOTE — Assessment & Plan Note (Addendum)
Left low back pain with intemrittent sciatica managed with exercise

## 2015-07-29 NOTE — Progress Notes (Signed)
Subjective:  Patient ID: Anthony Benjamin, male    DOB: [birth year]  Age: 39 y.o. MRN: [id number]  CC: Annual Exam   HPI ROHIL LESCH presents for physical  Acute complaints/concerns none.   Has hx of LL back pain with intermittent sciatica managed by exercise R medial meniscus injury followed by ortho Elevated A1c- fam hx of DM2. Managed with intermittent fasting and exercise    Outpatient Prescriptions Prior to Visit  Medication Sig Dispense Refill  . fluticasone (FLONASE) 50 MCG/ACT nasal spray INHALE 2 SPRAYS INTO BOTH NOSTRILS DAILY. 16 g 0  . ALPRAZolam (XANAX) 1 MG tablet Take 1 tablet (1 mg total) by mouth at bedtime as needed for anxiety. (Patient not taking: Reported on 03/30/2015) 30 tablet 0  . butalbital-acetaminophen-caffeine (ESGIC) 50-325-40 MG per tablet Take 1 tablet by mouth every 6 (six) hours as needed for headache. (Patient not taking: Reported on 03/30/2015) 30 tablet 0  . doxycycline (VIBRA-TABS) 100 MG tablet Take 1 tablet (100 mg total) by mouth 2 (two) times daily. (Patient not taking: Reported on 03/30/2015) 20 tablet 0  . HYDROcodone-acetaminophen (NORCO/VICODIN) 5-325 MG per tablet Take 1 tablet by mouth every 6 (six) hours as needed. (Patient not taking: Reported on 07/29/2015) 50 tablet 0  . meloxicam (MOBIC) 7.5 MG tablet Take 1 tablet (7.5 mg total) by mouth 2 (two) times daily. (Patient not taking: Reported on 03/30/2015) 60 tablet 0  . methocarbamol (ROBAXIN) 500 MG tablet Take 1 tablet (500 mg total) by mouth every 8 (eight) hours as needed for muscle spasms. (Patient not taking: Reported on 03/30/2015) 60 tablet 2  . traMADol (ULTRAM) 50 MG tablet Take 1 tablet (50 mg total) by mouth every 8 (eight) hours as needed. (Patient not taking: Reported on 03/30/2015) 90 tablet 0   No facility-administered medications prior to visit.    ROS Review of Systems  Constitutional: Negative for fever, chills, fatigue and unexpected weight change.  Eyes: Negative  for visual disturbance.  Respiratory: Negative for cough and shortness of breath.   Cardiovascular: Negative for chest pain, palpitations and leg swelling.  Gastrointestinal: Negative for nausea, vomiting, abdominal pain, diarrhea, constipation and blood in stool.  Endocrine: Negative for polydipsia, polyphagia and polyuria.  Musculoskeletal: Positive for back pain (hx of L sided sciatica ) and arthralgias (R knee ). Negative for myalgias, gait problem and neck pain.  Skin: Negative for rash.       R upper abdominal nodule   Allergic/Immunologic: Negative for immunocompromised state.  Neurological: Positive for headaches.  Hematological: Negative for adenopathy. Does not bruise/bleed easily.  Psychiatric/Behavioral: Negative for suicidal ideas, sleep disturbance and dysphoric mood. The patient is not nervous/anxious.     Objective:  BP 136/86 mmHg  Pulse 58  Temp(Src) 98.4 F (36.9 C) (Oral)  Resp 18  Ht  (1.803 m)  Wt 240 lb 6.4 oz (109.045 kg)  BMI 33.54 kg/m2  SpO2 99%  BP/Weight 07/29/2015 06/08/2015 04/26/2015  Systolic BP 136 145 134  Diastolic BP 86 79 78  Wt. (Lbs) 240.4 240 240  BMI 33.54 33.49 33.49   Physical Exam  Constitutional: He appears well-developed and well-nourished. No distress.  HENT:  Head: Normocephalic and atraumatic.  Right Ear: External ear normal.  Left Ear: External ear normal.  Nose: Nose normal.  Mouth/Throat: Oropharynx is clear and moist. No oropharyngeal exudate.  Eyes: Conjunctivae and EOM are normal. Pupils are equal, round, and reactive to light. Right eye exhibits no discharge. Left eye  exhibits discharge.  Neck: Normal range of motion. Neck supple. No thyromegaly present.  Cardiovascular: Normal rate, regular rhythm, normal heart sounds and intact distal pulses.   Pulmonary/Chest: Effort normal and breath sounds normal.  Abdominal: Soft. Bowel sounds are normal. He exhibits mass (small 1 x 1 cm RUQ subcutaneous nodule ). He  exhibits no distension. There is no tenderness. There is no rebound and no guarding.  Genitourinary: Penis normal. No penile tenderness.  Musculoskeletal: He exhibits no edema.       Right knee: Tenderness found.  Lymphadenopathy:    He has no cervical adenopathy.  Neurological: He is alert.  Skin: Skin is warm and dry. No rash noted. No erythema.  Psychiatric: He has a normal mood and affect.   Lab Results  Component Value Date   HGBA1C 6.0 07/29/2015   CBG 76  Assessment & Plan:    Meds ordered this encounter  Medications  . fluticasone (FLONASE) 50 MCG/ACT nasal spray    Sig: INHALE 2 SPRAYS INTO BOTH NOSTRILS DAILY.    Dispense:  16 g    Refill:  11    Follow-up: No Follow-up on file.   Dessa PhiJosalyn Shermika Balthaser MD

## 2015-08-09 ENCOUNTER — Ambulatory Visit (INDEPENDENT_AMBULATORY_CARE_PROVIDER_SITE_OTHER): Payer: BLUE CROSS/BLUE SHIELD | Admitting: Family Medicine

## 2015-08-09 VITALS — BP 138/86 | HR 71 | Ht 71.0 in | Wt 240.0 lb

## 2015-08-09 DIAGNOSIS — S8991XD Unspecified injury of right lower leg, subsequent encounter: Secondary | ICD-10-CM | POA: Diagnosis not present

## 2015-08-10 ENCOUNTER — Encounter: Payer: Self-pay | Admitting: Family Medicine

## 2015-08-11 NOTE — Progress Notes (Signed)
PCP: Doris CheadleADVANI, DEEPAK, MD  Subjective:   HPI: Patient is a 39 y.o. male here for right knee injury.  5/2: Patient reports he was playing basketball on 4/24 when he planted right foot, pushed off and felt a pop within right knee. Couldn't bear weight. + swelling. Went to ED - radiographs negative. Has been icing, taking hydrocodone. Pain up to 8/10 level at nighttime. Using immobilizer and crutches.  5/19: Patient reports he is about 80% improved. Pain gets severe if he tries to twist the knee. Using crutches, doing touch down weight bearing. Doing home exercises. Swelling improved.  6/6: Patient reports pain has improved, down to 1/10 soreness. Still feels unstable. Doing home exercises.  7/18: Patient reports his knee feels only a little better. Pain level 5/10. Doing physical therapy and home exercises. Hears clicking in the knee. No catching, locking, giving out though it does feel unstable.  8/30: Patient reports he has improved some since last visit. Is doing home exercise program regularly. Still painful to squat, do stairs. Working out in gym now too. Pain level 1/10. No giving out recently. No catching or locking.  10/31: Patient feels the same as last visit. Wearing brace. Difficulty using stairs, squatting - pain to 1/10. Feels unstable still. No catching, locking. No swelling. No fevers, skin changes, other complaints.  No past medical history on file.  Current Outpatient Prescriptions on File Prior to Visit  Medication Sig Dispense Refill  . fluticasone (FLONASE) 50 MCG/ACT nasal spray INHALE 2 SPRAYS INTO BOTH NOSTRILS DAILY. 16 g 11   No current facility-administered medications on file prior to visit.    No past surgical history on file.  Allergies  Allergen Reactions  . Aspirin   . Ibuprofen     Rash    Social History   Social History  . Marital Status: Single    Spouse Name: N/A  . Number of Children: N/A  . Years of  Education: N/A   Occupational History  . Not on file.   Social History Main Topics  . Smoking status: Never Smoker   . Smokeless tobacco: Not on file  . Alcohol Use: No  . Drug Use: No  . Sexual Activity: No   Other Topics Concern  . Not on file   Social History Narrative    Family History  Problem Relation Age of Onset  . Sudden death Neg Hx   . Hypertension Neg Hx   . Hyperlipidemia Neg Hx   . Heart attack Neg Hx   . Diabetes Neg Hx     BP 138/86 mmHg  Pulse 71  Ht 5\' 11"  (1.803 m)  Wt 240 lb (108.863 kg)  BMI 33.49 kg/m2  Review of Systems: See HPI above.    Objective:  Physical Exam:  Gen: NAD  Right knee: No effusion.  No bruising, other deformity. No TTP. FROM. Trace ant drawer, lachmanns.  Negative post drawer,  valgus/varus testing.  Negative medial mcmurrays, apleys Negative patellar apprehension. NV intact distally.  Left knee: FROM without pain.    Assessment & Plan:  1. Right knee injury - nondisplaced tibial plateau fracture and partial ACL tear.  Over 6 months out now.  Healed from the tibial plateau fracture.  His improvement following partial ACL tear has plateaued unfortunately and knee still feels unstable, problems with squatting and stairs.  Not improving following physical therapy, injection.  At this point would refer to orthopedics to discuss possible surgical intervention, repeating his MRI, repeating injection.

## 2015-08-11 NOTE — Assessment & Plan Note (Signed)
nondisplaced tibial plateau fracture and partial ACL tear.  Over 6 months out now.  Healed from the tibial plateau fracture.  His improvement following partial ACL tear has plateaued unfortunately and knee still feels unstable, problems with squatting and stairs.  Not improving following physical therapy, injection.  At this point would refer to orthopedics to discuss possible surgical intervention, repeating his MRI, repeating injection.

## 2015-08-12 IMAGING — CR DG KNEE COMPLETE 4+V*R*
1 series · 1 of 1 positions shown · non-contrast
Comparison: 02/08/2015 and 01/31/2015

CLINICAL DATA: Follow-up nondisplaced fracture of lateral tibial
plateau

EXAM:
RIGHT KNEE - COMPLETE 4+ VIEW

[view not recorded]
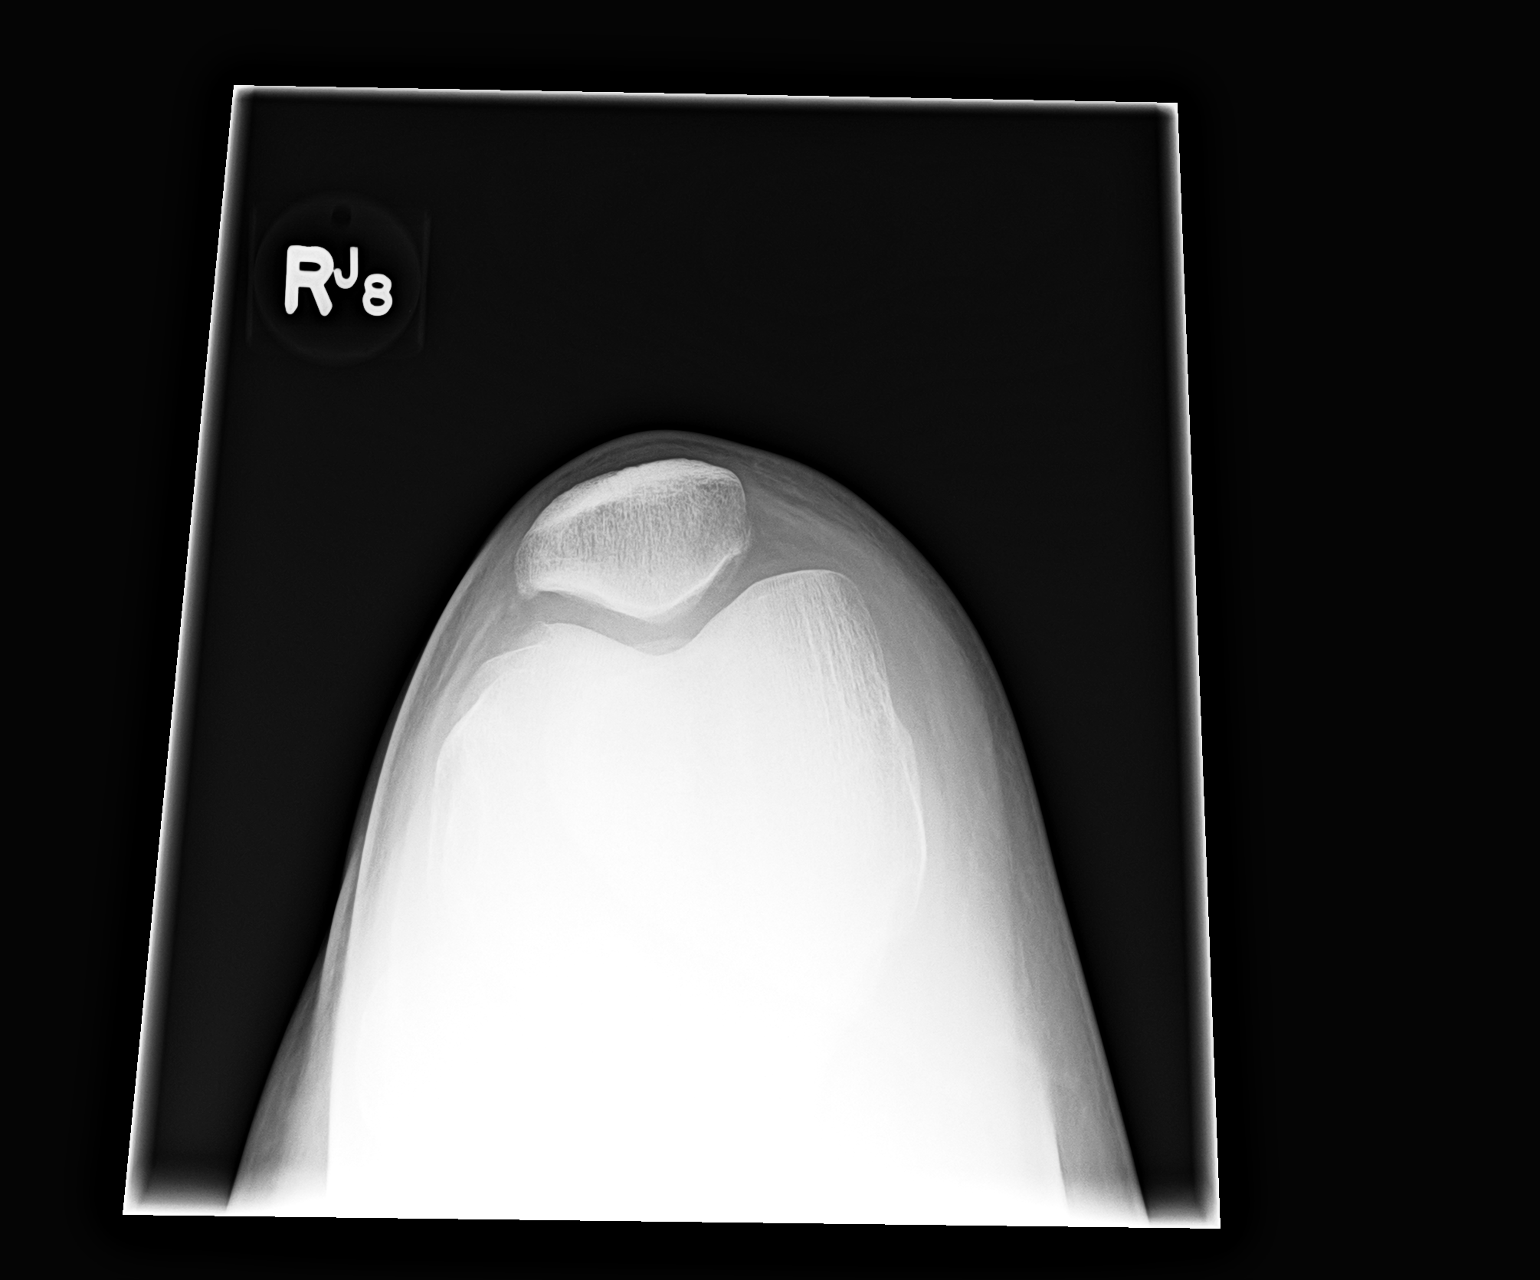

[1 of 1 positions shown; findings below may reference images not displayed]

FINDINGS: Five views of the right knee submitted. There is no displaced
fracture or subluxation. The nondisplaced fracture of the lateral
tibial plateau noted on MRI is not visualized on x-ray. The
alignment is preserved. No significant joint effusion.
IMPRESSION: There is no displaced fracture or subluxation. The nondisplaced
fracture of the lateral tibial plateau noted on MRI is not
visualized on x-ray. The alignment is preserved. No significant
joint effusion.

## 2015-08-18 ENCOUNTER — Ambulatory Visit: Payer: BLUE CROSS/BLUE SHIELD | Admitting: Family Medicine

## 2015-10-28 MED FILL — FLUTICASONE PROP 50 MCG SPR: 50 | 30 days supply | Qty: 16 | Fill #3

## 2015-12-02 MED FILL — FLUTICASONE PROP 50 MCG SPR: 50 | 30 days supply | Qty: 16 | Fill #4

## 2015-12-29 MED FILL — FLUTICASONE PROP 50 MCG SPR: 50 | 30 days supply | Qty: 16 | Fill #5

## 2016-01-10 ENCOUNTER — Telehealth: Payer: Self-pay

## 2016-01-10 ENCOUNTER — Telehealth: Payer: Self-pay | Admitting: Internal Medicine

## 2016-01-10 MED ORDER — LORATADINE 10 MG PO TABS: 10.0000 mg | ORAL_TABLET | Freq: Every day | ORAL | Status: DC

## 2016-01-10 MED FILL — LORATADINE 10 MG TABLET: 10 | 100 days supply | Qty: 100 | Fill #0

## 2016-01-10 NOTE — Telephone Encounter (Signed)
Patient returned phone call Requesting a refill on his claritan RX sent to pharmacy on file

## 2016-01-10 NOTE — Telephone Encounter (Signed)
Returned patient phone call Patient not available Message left on voice mail to return our call 

## 2016-01-10 NOTE — Telephone Encounter (Signed)
Patient is asking if he could take flonase with claritin his allergy are getting worse....please advise patient

## 2016-01-28 MED FILL — FLUTICASONE PROP 50 MCG SPR: 50 | 30 days supply | Qty: 16 | Fill #6

## 2016-02-25 MED FILL — FLUTICASONE PROP 50 MCG SPR: 50 | 30 days supply | Qty: 16 | Fill #7

## 2016-03-24 MED FILL — FLUTICASONE PROP 50 MCG SPR: 50 | 30 days supply | Qty: 16 | Fill #8

## 2016-04-05 MED FILL — LORATADINE 10 MG TABLET: 10 | 100 days supply | Qty: 100 | Fill #1

## 2016-04-19 ENCOUNTER — Telehealth: Payer: Self-pay | Admitting: Family Medicine

## 2016-04-19 DIAGNOSIS — Z0184 Encounter for antibody response examination: Secondary | ICD-10-CM

## 2016-04-19 NOTE — Telephone Encounter (Signed)
Pt. Called requesting to speak to nurse and see if we do titer test. Please f/u with pt.

## 2016-04-19 NOTE — Telephone Encounter (Signed)
Returned patients call an informed him that we do the titer test he will just need schedule an appointment with a physician. Patient stated he does have a physical coming up on the 24th and will get it done then

## 2016-04-21 NOTE — Telephone Encounter (Signed)
Please inform patient  Immunization form received I have ordered the MMR titer and patient advised to come in before his appt for lab draw so he can take the completed form with him after his physical.   I have entered his last Tdap that was done on 07/29/2015  I will hold onto the form until the MMR titer results

## 2016-04-24 ENCOUNTER — Telehealth: Payer: Self-pay

## 2016-04-24 NOTE — Telephone Encounter (Signed)
Pt is aware to come in before his physical to get his titer drawn pt states he will come in Friday to get it drawn cause his physical is on monday

## 2016-04-24 NOTE — Telephone Encounter (Signed)
Contacted patient to inform him he is down for a 930 appointment for labs on friday

## 2016-04-25 MED FILL — FLUTICASONE PROP 50 MCG SPR: 50 | 30 days supply | Qty: 16 | Fill #9

## 2016-04-28 ENCOUNTER — Ambulatory Visit: Payer: BLUE CROSS/BLUE SHIELD | Attending: Family Medicine

## 2016-04-28 DIAGNOSIS — Z0184 Encounter for antibody response examination: Secondary | ICD-10-CM | POA: Insufficient documentation

## 2016-04-28 NOTE — Progress Notes (Signed)
Patient here for lab test only.

## 2016-05-01 ENCOUNTER — Ambulatory Visit: Payer: BLUE CROSS/BLUE SHIELD | Attending: Family Medicine | Admitting: Family Medicine

## 2016-05-01 ENCOUNTER — Encounter: Payer: Self-pay | Admitting: Family Medicine

## 2016-05-01 VITALS — BP 131/87 | HR 63 | Temp 98.4°F | Resp 17 | Ht 71.0 in | Wt 241.0 lb

## 2016-05-01 DIAGNOSIS — J309 Allergic rhinitis, unspecified: Secondary | ICD-10-CM

## 2016-05-01 DIAGNOSIS — Z886 Allergy status to analgesic agent status: Secondary | ICD-10-CM | POA: Insufficient documentation

## 2016-05-01 DIAGNOSIS — Z0000 Encounter for general adult medical examination without abnormal findings: Secondary | ICD-10-CM

## 2016-05-01 LAB — COMPLETE METABOLIC PANEL WITH GFR
ALBUMIN: 4.4 g/dL (ref 3.6–5.1)
ALK PHOS: 60 U/L (ref 40–115)
ALT: 6 U/L — ABNORMAL LOW (ref 9–46)
AST: 20 U/L (ref 10–40)
BILIRUBIN TOTAL: 0.6 mg/dL (ref 0.2–1.2)
BUN: 9 mg/dL (ref 7–25)
CO2: 28 mmol/L (ref 20–31)
Calcium: 9.2 mg/dL (ref 8.6–10.3)
Chloride: 102 mmol/L (ref 98–110)
Creat: 0.92 mg/dL (ref 0.60–1.35)
GFR, Est African American: >89 mL/min (ref >=60)
GLUCOSE: 88 mg/dL (ref 65–99)
Potassium: 3.8 mmol/L (ref 3.5–5.3)
SODIUM: 138 mmol/L (ref 135–146)
TOTAL PROTEIN: 6.9 g/dL (ref 6.1–8.1)

## 2016-05-01 LAB — CBC
HCT: 43.3 % (ref 38.5–50.0)
HEMOGLOBIN: 14.5 g/dL (ref 13.2–17.1)
MCH: 25.8 pg — AB (ref 27.0–33.0)
MCHC: 33.5 g/dL (ref 32.0–36.0)
MCV: 76.9 fL — ABNORMAL LOW (ref 80.0–100.0)
MPV: 10 fL (ref 7.5–12.5)
Platelets: 248 10*3/uL (ref 140–400)
RBC: 5.63 MIL/uL (ref 4.20–5.80)
RDW: 14.4 % (ref 11.0–15.0)
WBC: 5.4 10*3/uL (ref 3.8–10.8)

## 2016-05-01 LAB — LIPID PANEL
Cholesterol: 141 mg/dL (ref 125–200)
HDL: 58 mg/dL (ref >=40)
LDL Cholesterol: 61 mg/dL (ref <130)
Total CHOL/HDL Ratio: 2.4 Ratio (ref <=5.0)
Triglycerides: 111 mg/dL (ref <150)
VLDL: 22 mg/dL (ref <30)

## 2016-05-01 LAB — MEASLES/MUMPS/RUBELLA IMMUNITY
Mumps IgG: 16.9 AU/mL — ABNORMAL HIGH (ref <9.00)
RUBEOLA IGG: 264 [AU]/ml — AB (ref <25.00)
Rubella: 2.9 Index — ABNORMAL HIGH (ref <0.90)

## 2016-05-01 MED ORDER — CETIRIZINE HCL 10 MG PO TABS: 10.0000 mg | ORAL_TABLET | Freq: Every day | ORAL | 11 refills | Status: AC

## 2016-05-01 MED FILL — ALL DAY ALLERGY 10 MG TAB: 10 | 100 days supply | Qty: 100 | Fill #0

## 2016-05-01 NOTE — Assessment & Plan Note (Signed)
Change from claritin to zyrtec Continue flonase

## 2016-05-01 NOTE — Progress Notes (Signed)
SUBJECTIVE:  Anthony Benjamin is a 40 y.o. male presenting for his annual checkup and school physical. He is in need of his completed immunization form. He is awaiting MMR titer results.  Social History  Substance Use Topics  . Smoking status: Never Smoker  . Smokeless tobacco: Not on file  . Alcohol use No    Current Outpatient Prescriptions  Medication Sig Dispense Refill  . fluticasone (FLONASE) 50 MCG/ACT nasal spray INHALE 2 SPRAYS INTO BOTH NOSTRILS DAILY. 16 g 11  . loratadine (CLARITIN) 10 MG tablet Take 1 tablet (10 mg total) by mouth daily. 30 tablet 11   No current facility-administered medications for this visit.    Allergies: Aspirin and Ibuprofen   ROS:  Feeling well. No dyspnea or chest pain on exertion. No abdominal pain, change in bowel habits, black or bloody stools. No urinary tract or prostatic symptoms. No neurological complaints. Some congestion.   OBJECTIVE:  The patient appears well, alert, oriented x 3, in no distress.  BP 131/87 (BP Location: Right Arm, Patient Position: Sitting, Cuff Size: Large)   Pulse 63   Temp 98.4 F (36.9 C) (Oral)   Resp 17   Ht _0  (1.803 m)   Wt 241 lb (109.3 kg)   SpO2 100%   BMI 33.61 kg/m  ENT normal.  Neck supple. No adenopathy or thyromegaly. PERLA. Lungs are clear, good air entry, no wheezes, rhonchi or rales. S1 and S2 normal, no murmurs, regular rate and rhythm. Abdomen is soft without tenderness, guarding, mass or organomegaly. GU exam: no penile lesions or discharge, no testicular masses or tenderness, no hernias.  Extremities show no edema, normal peripheral pulses. Neurological is normal without focal findings.  ASSESSMENT:  healthy adult male  PLAN:  return for routine annual checkups

## 2016-05-01 NOTE — Patient Instructions (Addendum)
Diagnoses and all orders for this visit:  Well adult health check -     CBC -     COMPLETE METABOLIC PANEL WITH GFR -     Lipid Panel  Allergic rhinitis, unspecified allergic rhinitis type -     cetirizine (ZYRTEC) 10 MG tablet; Take 1 tablet (10 mg total) by mouth daily.   You will be called as soon as MMR titer comes in and your paperwork is ready for pick up  F/u in 1 year for wellness visit  Dr. Adrian Blackwater

## 2016-05-02 ENCOUNTER — Telehealth: Payer: Self-pay | Admitting: Family Medicine

## 2016-05-02 NOTE — Telephone Encounter (Signed)
Patient picked up immunization paperwork

## 2016-05-02 NOTE — Telephone Encounter (Signed)
Patient is requesting results for MMR titer. Patient would like a print out [facility] follow up

## 2016-05-02 NOTE — Telephone Encounter (Signed)
MMR titers or high positive  He is immune. He has already been informed of results by Alycia.   Please print off results for patient and call patient to know they have been placed up front for patient pick up.   Thank you,  Dr. Adrian Blackwater

## 2016-05-15 MED FILL — AMOXICILLIN 500 MG CAPSULE: 500 | 10 days supply | Qty: 30 | Fill #0

## 2016-05-26 MED FILL — CLINDAMYCIN HCL 300 MG CAPS: 300 | 4 days supply | Qty: 12 | Fill #0

## 2016-05-26 MED FILL — FLUTICASONE PROP 50 MCG SPR: 50 | 30 days supply | Qty: 16 | Fill #10

## 2016-06-23 MED FILL — FLUTICASONE PROP 50 MCG SPR: 50 | 30 days supply | Qty: 16 | Fill #11

## 2016-07-20 ENCOUNTER — Other Ambulatory Visit: Payer: Self-pay | Admitting: Family Medicine

## 2016-07-20 DIAGNOSIS — J309 Allergic rhinitis, unspecified: Secondary | ICD-10-CM

## 2016-07-20 MED FILL — FLUTICASONE PROP 50 MCG SPR: 50 | 30 days supply | Qty: 16 | Fill #0

## 2016-08-01 MED FILL — LORATADINE 10 MG TABLET: 10 | 100 days supply | Qty: 100 | Fill #2

## 2016-08-28 MED FILL — FLUTICASONE PROP 50 MCG SPR: 50 | 30 days supply | Qty: 16 | Fill #1

## 2016-09-22 MED FILL — FLUTICASONE PROP 50 MCG SPR: 50 | 30 days supply | Qty: 16 | Fill #2

## 2020-09-26 ENCOUNTER — Other Ambulatory Visit: Payer: Self-pay

## 2020-09-26 ENCOUNTER — Encounter (HOSPITAL_BASED_OUTPATIENT_CLINIC_OR_DEPARTMENT_OTHER): Payer: Self-pay | Admitting: Emergency Medicine

## 2020-09-26 ENCOUNTER — Emergency Department (HOSPITAL_BASED_OUTPATIENT_CLINIC_OR_DEPARTMENT_OTHER)
Admission: EM | Admit: 2020-09-26 | Discharge: 2020-09-26 | Disposition: A | Discharge status: Home / Self Care | Payer: 59 | Attending: Emergency Medicine | Admitting: Emergency Medicine

## 2020-09-26 DIAGNOSIS — R059 Cough, unspecified: Secondary | ICD-10-CM | POA: Diagnosis present

## 2020-09-26 DIAGNOSIS — Z20822 Contact with and (suspected) exposure to covid-19: Secondary | ICD-10-CM | POA: Diagnosis not present

## 2020-09-26 DIAGNOSIS — J069 Acute upper respiratory infection, unspecified: Secondary | ICD-10-CM | POA: Diagnosis not present

## 2020-09-26 DIAGNOSIS — R439 Unspecified disturbances of smell and taste: Secondary | ICD-10-CM | POA: Diagnosis not present

## 2020-09-26 LAB — RESP PANEL BY RT-PCR (FLU A&B, COVID) ARPGX2
Influenza A by PCR: NEGATIVE
Influenza B by PCR: NEGATIVE
SARS Coronavirus 2 by RT PCR: NEGATIVE

## 2020-09-26 NOTE — ED Triage Notes (Signed)
Pt c/o cough, headache and loss of taste x 1 week. Pt had negative home COVID test, last one last night. Pt has not been vaccinated against COVID.

## 2020-09-26 NOTE — Discharge Instructions (Addendum)
Your COVID and flu test came back negative today. I do suspect this may be a false negative test given the symptoms you have been having however you have been self isolating for 10 days and are cleared tomorrow. Your symptoms may also be related to another virus.   Please drink plenty of fluids to stay hydrated. Take Tylenol and Ibuprofen as needed for headaches. Follow up with your PCP for same.   Return to the ED for any worsening symptoms

## 2020-09-26 NOTE — ED Provider Notes (Signed)
[facility] EMERGENCY DEPARTMENT Provider Note   CSN: [id number] Arrival date & time: 09/26/20  1012     History Chief Complaint  Patient presents with  . Cough    Sarah JADRIEL SAXER is a 44 y.o. male who presents to the ED today with complaint of dry cough x 10 days. Pt also complains of headache, loss of taste, and loss of smell. He denies any recent sick contacts. He is unvaccinated against COVID 19. He has been taking home tests without relief. He has not taken anything for his symptoms as he does not like taking medications. Pt denies vision changes, neck stiffness, rash, confusion, unilateral weakness or numbness, chest pain, SOB, abdominal pain, nausea, vomiting, diarrhea, or any other associated symptoms.   The history is provided by the patient and medical records.       History reviewed. No pertinent past medical history.  Patient Active Problem List   Diagnosis Date Noted  . Subcutaneous nodule 07/29/2015  . Allergic rhinitis 07/29/2015  . Right knee injury 02/09/2015  . IFG (impaired fasting glucose) 02/09/2014  . Low back pain 06/11/2013    History reviewed. No pertinent surgical history.     Family History  Problem Relation Age of Onset  . Sudden death Neg Hx   . Hypertension Neg Hx   . Hyperlipidemia Neg Hx   . Heart attack Neg Hx   . Diabetes Neg Hx     Social History   Tobacco Use  . Smoking status: Never Smoker  . Smokeless tobacco: Never Used  Vaping Use  . Vaping Use: Never used  Substance Use Topics  . Alcohol use: No    Alcohol/week: 0.0 standard drinks  . Drug use: No    Home Medications Prior to Admission medications   Medication Sig Start Date End Date Taking? Authorizing Provider  fluticasone (FLONASE) 50 MCG/ACT nasal spray INHALE 2 SPRAYS INTO BOTH NOSTRILS DAILY. 07/20/16  Yes Funches, Josalyn, MD  cetirizine (ZYRTEC) 10 MG tablet Take 1 tablet (10 mg total) by mouth daily. 05/01/16   Funches, Gerilyn Nestle, MD  fexofenadine  (ALLEGRA) 180 MG tablet Take 180 mg by mouth daily.    [provider]  Multiple Vitamin (MULTIVITAMIN) capsule Take 1 capsule by mouth daily.    [provider]    Allergies    Aspirin and Ibuprofen  Review of Systems   Review of Systems  Constitutional: Positive for fatigue. Negative for chills and fever.  HENT:       + loss of taste and smell  Respiratory: Positive for cough. Negative for shortness of breath.   Cardiovascular: Negative for chest pain.  Gastrointestinal: Negative for abdominal pain, nausea and vomiting.    Physical Exam Updated Vital Signs BP (!) 147/92 (BP Location: Left Arm)   Pulse 85   Temp 98.7 F (37.1 C) (Oral)   Resp 16   Ht 5\' 11"  (1.803 m)   Wt 96.2 kg   SpO2 99%   BMI 29.57 kg/m   Physical Exam Vitals and nursing note reviewed.  Constitutional:      Appearance: He is not ill-appearing or diaphoretic.  HENT:     Head: Normocephalic and atraumatic.     Right Ear: Tympanic membrane normal.     Left Ear: Tympanic membrane normal.     Mouth/Throat:     Mouth: Mucous membranes are moist.     Pharynx: No oropharyngeal exudate or posterior oropharyngeal erythema.  Eyes:     Conjunctiva/sclera: Conjunctivae  normal.  Cardiovascular:     Rate and Rhythm: Normal rate and regular rhythm.     Pulses: Normal pulses.  Pulmonary:     Effort: Pulmonary effort is normal.     Breath sounds: Normal breath sounds. No wheezing, rhonchi or rales.  Abdominal:     Tenderness: There is no abdominal tenderness. There is no guarding or rebound.  Musculoskeletal:     Cervical back: No rigidity.  Lymphadenopathy:     Cervical: No cervical adenopathy.  Skin:    General: Skin is warm and dry.     Coloration: Skin is not jaundiced.  Neurological:     Mental Status: He is alert.     Comments: Alert and oriented to self, place, time and event.   Speech is fluent, clear without dysarthria or dysphasia.   Strength 5/5 in upper/lower  extremities  Sensation intact in upper/lower extremities   Normal gait.  Negative Romberg. No pronator drift.  Normal finger-to-nose and feet tapping.  CN I not tested  CN II grossly intact visual fields bilaterally. Did not visualize posterior eye.   CN III, IV, VI PERRLA and EOMs intact bilaterally  CN V Intact sensation to sharp and light touch to the face  CN VII facial movements symmetric  CN VIII not tested  CN IX, X no uvula deviation, symmetric rise of soft palate  CN XI 5/5 SCM and trapezius strength bilaterally  CN XII Midline tongue protrusion, symmetric L/R movements      ED Results / Procedures / Treatments   Labs (all labs ordered are listed, but only abnormal results are displayed) Labs Reviewed  RESP PANEL BY RT-PCR (FLU A&B, COVID) [id number]    EKG None  Radiology No results found.  Procedures Procedures (including critical care time)  Medications Ordered in ED Medications - No data to display  ED Course  I have reviewed the triage vital signs and the nursing notes.  Pertinent labs & imaging results that were available during my care of the patient were reviewed by me and considered in my medical decision making (see chart for details).    MDM Rules/Calculators/A&P                          44 year old male presents to the ED today with complaint of Covid-like symptoms for the past 10 days.  This includes cough, loss of taste, loss of smell, headache.  He has not been taking any medications for his symptoms.  Has been taking home Covid test which have been negative.  He is unvaccinated.  On arrival to the ED vitals are stable.  Patient is afebrile, nontachycardic and nontachypneic.  He is overall well-appearing.  He had a Covid and flu test while in the waiting room which have returned negative.  On exam patient is able speak in full sentences without difficulty, not actively coughing.  Lungs clear to auscultation bilaterally.  His TMs are clear, his throat  is clear without any signs of erythema or exudate.  No cervical adenopathy.  His loss of taste and smell with him being unvaccinated it sounds like he probably has Covid despite this negative test.  He has been staying at home for the past 10 days from symptom onset and has been self isolating.  He has not had any fevers and is afebrile here.  Have instructed to continue drinking plenty of fluids to stay hydrated.  Ibuprofen and Tylenol as needed for pain.  He has no focal neuro deficits on exam today no meningeal signs.  I suspect his headache is related to his viral illness.  Will have patient follow-up with his PCP for same.  He is in agreement with plan is stable for discharge.  This note was prepared using Dragon voice recognition software and may include unintentional dictation errors due to the inherent limitations of voice recognition software.  Hilery E Guzzetta was evaluated in Emergency Department on 09/26/2020 for the symptoms described in the history of present illness. He was evaluated in the context of the global COVID-19 pandemic, which necessitated consideration that the patient might be at risk for infection with the SARS-CoV-2 virus that causes COVID-19. Institutional protocols and algorithms that pertain to the evaluation of patients at risk for COVID-19 are in a state of rapid change based on information released by regulatory bodies including the CDC and federal and state organizations. These policies and algorithms were followed during the patient's care in the ED.   Final Clinical Impression(s) / ED Diagnoses Final diagnoses:  Viral URI with cough    Rx / DC Orders ED Discharge Orders    None       Discharge Instructions     Your COVID and flu test came back negative today. I do suspect this may be a false negative test given the symptoms you have been having however you have been self isolating for 10 days and are cleared tomorrow. Your symptoms may also be related to  another virus.   Please drink plenty of fluids to stay hydrated. Take Tylenol and Ibuprofen as needed for headaches. Follow up with your PCP for same.   Return to the ED for any worsening symptoms        Tanda Rockers, PA-C 09/26/20 1158    Little, Ambrose Finland, MD 09/27/20 1335

## 2021-09-19 ENCOUNTER — Other Ambulatory Visit (HOSPITAL_BASED_OUTPATIENT_CLINIC_OR_DEPARTMENT_OTHER): Payer: Self-pay

## 2021-09-19 MED ORDER — FLUTICASONE PROPIONATE 50 MCG/ACT NA SUSP
NASAL | 0 refills | Status: AC
  Filled 2021-09-19: qty 16, 60d supply, fill #0

## 2021-09-30 ENCOUNTER — Other Ambulatory Visit (HOSPITAL_BASED_OUTPATIENT_CLINIC_OR_DEPARTMENT_OTHER): Payer: Self-pay

## 2024-04-25 ENCOUNTER — Encounter: Payer: Self-pay | Admitting: Advanced Practice Midwife
